# Patient Record
Sex: Male | Born: 1960 | Hispanic: No | Marital: Married | Smoking: Never smoker
Health system: Southern US, Community
[De-identification: ages and names within clinical notes are randomized; demographics above are authoritative.]

## PROBLEM LIST (undated history)

## (undated) DIAGNOSIS — T7840XA Allergy, unspecified, initial encounter: Secondary | ICD-10-CM

## (undated) DIAGNOSIS — E785 Hyperlipidemia, unspecified: Secondary | ICD-10-CM

## (undated) DIAGNOSIS — C4492 Squamous cell carcinoma of skin, unspecified: Secondary | ICD-10-CM

## (undated) DIAGNOSIS — J309 Allergic rhinitis, unspecified: Secondary | ICD-10-CM

## (undated) HISTORY — PX: TOOTH EXTRACTION: SUR596

## (undated) HISTORY — DX: Hyperlipidemia, unspecified: E78.5

## (undated) HISTORY — DX: Allergy, unspecified, initial encounter: T78.40XA

## (undated) HISTORY — PX: MOHS SURGERY: SUR867

## (undated) HISTORY — DX: Squamous cell carcinoma of skin, unspecified: C44.92

## (undated) HISTORY — DX: Allergic rhinitis, unspecified: J30.9

---

## 2013-07-04 ENCOUNTER — Encounter: Payer: Self-pay | Admitting: Family Medicine

## 2013-07-04 ENCOUNTER — Ambulatory Visit (INDEPENDENT_AMBULATORY_CARE_PROVIDER_SITE_OTHER): Payer: 59 | Admitting: Family Medicine

## 2013-07-04 VITALS — BP 128/82 | HR 78 | Temp 98.5°F | Ht 70.75 in | Wt 200.0 lb

## 2013-07-04 DIAGNOSIS — Z7189 Other specified counseling: Secondary | ICD-10-CM

## 2013-07-04 DIAGNOSIS — Z1211 Encounter for screening for malignant neoplasm of colon: Secondary | ICD-10-CM

## 2013-07-04 DIAGNOSIS — Z7689 Persons encountering health services in other specified circumstances: Secondary | ICD-10-CM

## 2013-07-04 DIAGNOSIS — Z23 Encounter for immunization: Secondary | ICD-10-CM

## 2013-07-04 DIAGNOSIS — J309 Allergic rhinitis, unspecified: Secondary | ICD-10-CM

## 2013-07-04 DIAGNOSIS — Z0000 Encounter for general adult medical examination without abnormal findings: Secondary | ICD-10-CM

## 2013-07-04 LAB — CBC WITH DIFFERENTIAL/PLATELET
BASOS ABS: 0 10*3/uL (ref 0.0–0.1)
Basophils Relative: 0.4 % (ref 0.0–3.0)
Eosinophils Absolute: 0.1 10*3/uL (ref 0.0–0.7)
Eosinophils Relative: 2 % (ref 0.0–5.0)
HCT: 44.1 % (ref 39.0–52.0)
Hemoglobin: 15.2 g/dL (ref 13.0–17.0)
LYMPHS PCT: 40.4 % (ref 12.0–46.0)
Lymphs Abs: 1.8 10*3/uL (ref 0.7–4.0)
MCHC: 34.4 g/dL (ref 30.0–36.0)
MCV: 91.7 fl (ref 78.0–100.0)
MONOS PCT: 9.7 % (ref 3.0–12.0)
Monocytes Absolute: 0.4 10*3/uL (ref 0.1–1.0)
Neutro Abs: 2.2 10*3/uL (ref 1.4–7.7)
Neutrophils Relative %: 47.5 % (ref 43.0–77.0)
Platelets: 217 10*3/uL (ref 150.0–400.0)
RBC: 4.81 Mil/uL (ref 4.22–5.81)
RDW: 12.7 % (ref 11.5–15.5)
WBC: 4.6 10*3/uL (ref 4.0–10.5)

## 2013-07-04 LAB — LIPID PANEL
CHOL/HDL RATIO: 4
Cholesterol: 210 mg/dL — ABNORMAL HIGH (ref 0–200)
HDL: 52.1 mg/dL (ref >39.00)
LDL Cholesterol: 144 mg/dL — ABNORMAL HIGH (ref 0–99)
Triglycerides: 72 mg/dL (ref 0.0–149.0)
VLDL: 14.4 mg/dL (ref 0.0–40.0)

## 2013-07-04 LAB — HEMOGLOBIN A1C: HEMOGLOBIN A1C: 5.1 % (ref 4.6–6.5)

## 2013-07-04 MED ORDER — TETANUS-DIPHTH-ACELL PERTUSSIS 5-2.5-18.5 LF-MCG/0.5 IM SUSP: 0.5000 mL | Freq: Once | INTRAMUSCULAR | Status: DC

## 2013-07-04 MED ORDER — FLUTICASONE PROPIONATE 50 MCG/ACT NA SUSP: 2.0000 | Freq: Every day | NASAL | Status: DC

## 2013-07-04 NOTE — Addendum Note (Signed)
Addended by: Agnes Lawrence on: 07/04/2013 09:43 AM   Modules accepted: Orders

## 2013-07-04 NOTE — Progress Notes (Signed)
No chief complaint on file.   HPI:  Glenn Bowman is here to establish care. Has not seen a doctor in about 10 years or more. Last PCP and physical: had labs at work about 2 years ago.  Has the following chronic problems and concerns today:  There are no active problems to display for this patient.  R cervical LAD: -for a few weeks -allergies - nasal congestion, cough, PND -denies: pain, fever, sore throat -always has allergy issues this time of the year  Health Maintenance: -wants tdap today   ROS: See pertinent positives and negatives per HPI.  Past Medical History  Diagnosis Date  . Cancer     basal cell, sees Dr. Delman Cheadle in dermatology    Family History  Problem Relation Age of Onset  . Heart disease Mother 39  . Hypertension Mother   . Cancer Father     multiple myeloma  . Heart disease Father 25  . Hypertension Father   . Hypertension Brother     History   Social History  . Marital Status: Married    Spouse Name: N/A    Number of Children: N/A  . Years of Education: N/A   Social History Main Topics  . Smoking status: Never Smoker   . Smokeless tobacco: None  . Alcohol Use: Yes     Comment: 2 beers 1-2 times per week  . Drug Use: None  . Sexual Activity: None   Other Topics Concern  . 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  GENERAL: vitals reviewed and listed above, alert, oriented, appears well hydrated and in no acute distress  HEENT: atraumatic, conjunttiva  clear, no obvious abnormalities on inspection of external nose and ears, normal appearance of ear canals and TMs, clear nasal congestion, mild post oropharyngeal erythema with PND, no tonsillar edema or exudate, no sinus TTP  NECK: no obvious masses on inspection, few small ant cervical nodes bilat, mobile  LUNGS: clear to auscultation bilaterally, no wheezes, rales or rhonchi, good air movement  CV: HRRR, no peripheral edema  MS: moves all extremities without noticeable abnormality  PSYCH: pleasant and cooperative, no obvious depression or anxiety  ASSESSMENT AND PLAN:  Discussed the following assessment and plan:  Allergic rhinitis - Plan: fluticasone (FLONASE) 50 MCG/ACT nasal spray, CBC with Differential  Encounter to establish care  Visit for preventive health examination - Plan: Lipid Panel, Hemoglobin A1c  Colon cancer screening - Plan: Ambulatory referral to Gastroenterology   -We reviewed the PMH, PSH, FH, SH, Meds and Allergies. -We provided refills for any medications we will prescribe as needed. -We addressed current concerns per orders and patient instructions. -We have asked for records for pertinent exams, studies, vaccines and notes from previous providers. -We have advised patient to follow up per instructions below. -NON-FASTING labs -referred for colon cancer screening after discussion -tdap -  for allergies, LAD, eustachian tube dysfunction antihistamine and INS - follow up in 3 weeks   -Patient advised to return or notify a doctor immediately if symptoms worsen or persist or new concerns arise.  Patient Instructions  -We have ordered labs or studies at this visit. It can take up to 1-2 weeks for results and processing. We will contact you with instructions IF your results are abnormal. Normal results will be released to your [facility]. If you have not heard from [place] or can not find your results in [facility] in 2 weeks please contact our office.  -We placed a  referral for you as discussed for your colonoscopy. It usually takes about 1-2 weeks to process and schedule this referral. If you have not heard from [place] regarding this appointment in 2 weeks please contact our office.   -PLEASE SIGN UP FOR MYCHART TODAY   We recommend the following healthy lifestyle measures: - eat a healthy diet consisting of lots of vegetables, fruits, beans, nuts, seeds, healthy meats such as white chicken and fish and whole grains.  - avoid fried foods, fast food, processed foods, sodas, red meet and other fattening foods.  - get a least 150 minutes of aerobic exercise per week.   Follow up in: 2-3 weeks for swollen glands and sinus issues      Lucretia Kern

## 2013-07-04 NOTE — Progress Notes (Signed)
Pre visit review using our clinic review tool, if applicable. No additional management support is needed unless otherwise documented below in the visit note. 

## 2013-07-04 NOTE — Patient Instructions (Addendum)
-  We have ordered labs or studies at this visit. It can take up to 1-2 weeks for results and processing. We will contact you with instructions IF your results are abnormal. Normal results will be released to your [facility]. If you have not heard from [place] or can not find your results in [facility] in 2 weeks please contact our office.  -We placed a referral for you as discussed for your colonoscopy. It usually takes about 1-2 weeks to process and schedule this referral. If you have not heard from [place] regarding this appointment in 2 weeks please contact our office.   -PLEASE SIGN UP FOR MYCHART TODAY   We recommend the following healthy lifestyle measures: - eat a healthy diet consisting of lots of vegetables, fruits, beans, nuts, seeds, healthy meats such as white chicken and fish and whole grains.  - avoid fried foods, fast food, processed foods, sodas, red meet and other fattening foods.  - get a least 150 minutes of aerobic exercise per week.   Start flonase 2 sprays each nostril daily and take your allergy medication daily  Follow up in: 2-3 weeks for swollen glands and sinus issues

## 2013-07-19 NOTE — Addendum Note (Signed)
Addended by: Agnes Lawrence on: 07/19/2013 12:54 PM   Modules accepted: Orders

## 2013-08-04 ENCOUNTER — Ambulatory Visit (INDEPENDENT_AMBULATORY_CARE_PROVIDER_SITE_OTHER): Payer: 59 | Admitting: Family Medicine

## 2013-08-04 ENCOUNTER — Encounter: Payer: Self-pay | Admitting: Family Medicine

## 2013-08-04 VITALS — BP 118/82 | HR 69 | Temp 98.6°F | Ht 70.75 in | Wt 196.0 lb

## 2013-08-04 DIAGNOSIS — J329 Chronic sinusitis, unspecified: Secondary | ICD-10-CM

## 2013-08-04 DIAGNOSIS — R590 Localized enlarged lymph nodes: Secondary | ICD-10-CM

## 2013-08-04 DIAGNOSIS — H6980 Other specified disorders of Eustachian tube, unspecified ear: Secondary | ICD-10-CM

## 2013-08-04 DIAGNOSIS — R599 Enlarged lymph nodes, unspecified: Secondary | ICD-10-CM

## 2013-08-04 MED ORDER — AMOXICILLIN-POT CLAVULANATE 875-125 MG PO TABS: 1.0000 | ORAL_TABLET | Freq: Two times a day (BID) | ORAL | Status: DC

## 2013-08-04 NOTE — Progress Notes (Signed)
Pre visit review using our clinic review tool, if applicable. No additional management support is needed unless otherwise documented below in the visit note. 

## 2013-08-04 NOTE — Progress Notes (Signed)
No chief complaint on file.   HPI:  Chronic Rhinosinusitis/Cervical LAD/ Eustachian: -treated with antihistamine and INS 1 month ago -reports: not much better - still the same -LAD R cervical, ear fullness, nasal congestion, PND, recent conjuntivitis -denies: fevers, weight loss, sore throat  ROS: See pertinent positives and negatives per HPI.  Past Medical History  Diagnosis Date  . Cancer     basal cell, sees Dr. Delman Cheadle in dermatology    Past Surgical History  Procedure Laterality Date  . Skin surgery      Family History  Problem Relation Age of Onset  . Heart disease Mother 39  . Hypertension Mother   . Cancer Father     multiple myeloma  . Heart disease Father 67  . Hypertension Father   . Hypertension Brother     History   Social History  . Marital Status: Married    Spouse Name: N/A    Number of Children: N/A  . Years of Education: N/A   Social History Main Topics  . Smoking status: Never Smoker   . Smokeless tobacco: None  . Alcohol Use: Yes     Comment: 2 beers 1-2 times per week  . Drug Use: None  . Sexual Activity: None   Other Topics Concern  . 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  GENERAL: vitals reviewed and listed above, alert, oriented, appears well hydrated and in no acute distress  HEENT: atraumatic, conjunttiva clear, no  obvious abnormalities on inspection of external nose and ears, normal appearance of ear canals and TMs except clear effusion R, nasal congestion, mild post oropharyngeal erythema with PND, no tonsillar edema or exudate, no sinus TTP  NECK: no obvious masses on inspection  LUNGS: clear to auscultation bilaterally, no wheezes, rales or rhonchi, good air movement  CV: HRRR, no peripheral edema  MS: moves all extremities without noticeable abnormality  PSYCH: pleasant and cooperative, no obvious depression or anxiety  ASSESSMENT AND PLAN:  Discussed the following assessment and plan:  LAD (lymphadenopathy), cervical - Plan: amoxicillin-clavulanate (AUGMENTIN) 875-125 MG per tablet  Sinusitis - Plan: amoxicillin-clavulanate (AUGMENTIN) 875-125 MG per tablet  -we discussed possible serious and likely etiologies, workup and treatment, treatment risks and return precautions -after this discussion, Dajon opted for course of augmentin then he will call and see ENT if not resolved -follow up advised after finishing abx -of course, we advised [place]  to return or notify a doctor immediately if symptoms worsen or persist or new concerns arise.  -Patient advised to return or notify a doctor immediately if symptoms worsen or persist or new concerns arise.  There are no Patient Instructions on file for this visit.   Colin Benton R.

## 2013-08-07 ENCOUNTER — Encounter: Payer: Self-pay | Admitting: Family Medicine

## 2013-08-21 ENCOUNTER — Encounter: Payer: Self-pay | Admitting: Family Medicine

## 2013-08-21 ENCOUNTER — Ambulatory Visit (INDEPENDENT_AMBULATORY_CARE_PROVIDER_SITE_OTHER): Payer: 59 | Admitting: Family Medicine

## 2013-08-21 VITALS — BP 122/82 | HR 76 | Temp 98.1°F | Ht 70.75 in | Wt 202.0 lb

## 2013-08-21 DIAGNOSIS — R590 Localized enlarged lymph nodes: Secondary | ICD-10-CM

## 2013-08-21 DIAGNOSIS — R599 Enlarged lymph nodes, unspecified: Secondary | ICD-10-CM

## 2013-08-21 NOTE — Progress Notes (Signed)
Pre visit review using our clinic review tool, if applicable. No additional management support is needed unless otherwise documented below in the visit note. 

## 2013-08-21 NOTE — Progress Notes (Signed)
No chief complaint on file.   HPI:  PND/cervical LAD: -reports no change -denies: fevers, malaise, sore throat, congestion, sore throat ROS: See pertinent positives and negatives per HPI.  Past Medical History  Diagnosis Date  . Cancer     basal cell, sees Dr. Delman Cheadle in dermatology    Past Surgical History  Procedure Laterality Date  . Skin surgery      Family History  Problem Relation Age of Onset  . Heart disease Mother 15  . Hypertension Mother   . Cancer Father     multiple myeloma  . Heart disease Father 17  . Hypertension Father   . Hypertension Brother     History   Social History  . Marital Status: Married    Spouse Name: N/A    Number of Children: N/A  . Years of Education: N/A   Social History Main Topics  . Smoking status: Never Smoker   . Smokeless tobacco: None  . Alcohol Use: Yes     Comment: 2 beers 1-2 times per week  . Drug Use: None  . Sexual Activity: None   Other Topics Concern  . 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  EXAM:  Filed Vitals:   08/21/13 0852  BP: 122/82  Pulse: 76  Temp: 98.1 F (36.7 C)    Body mass index is 28.37 kg/(m^2).  GENERAL: vitals reviewed and listed above, alert, oriented, appears well hydrated and in no acute distress  HEENT: atraumatic, conjunttiva clear, no obvious abnormalities on inspection of external nose and ears, small R superior ant cervical lymph nod <1 cm in diameter, non tender  NECK: no obvious masses on inspection  LUNGS: clear to auscultation bilaterally, no wheezes, rales or rhonchi, good air movement  CV: HRRR, no peripheral edema  MS: moves all extremities without noticeable abnormality  PSYCH: pleasant and  cooperative, no obvious depression or anxiety  ASSESSMENT AND PLAN:  Discussed the following assessment and plan:  Cervical lymphadenopathy  -small, nontender R ant cervical LAD, cbc with diff normal, s/p INS and augmentin, he wishes to call ENT on his own to schedule evaluation, brochure provided -we discussed possible serious and likely etiologies, workup and treatment, treatment risks and return precautions -of course, we advised [place]  to return or notify a doctor immediately if symptoms worsen or persist or new concerns arise.  Patient Instructions  -please call the ear, nose and throat doctor for evaluation     Lucretia Kern.

## 2013-08-21 NOTE — Patient Instructions (Signed)
-  please call the ear, nose and throat doctor for evaluation

## 2013-10-24 ENCOUNTER — Encounter: Payer: Self-pay | Admitting: Family Medicine

## 2013-12-08 ENCOUNTER — Other Ambulatory Visit: Payer: Self-pay

## 2016-01-06 ENCOUNTER — Telehealth: Payer: Self-pay | Admitting: Family Medicine

## 2016-01-06 NOTE — Telephone Encounter (Signed)
Pt called in and wanted to know if he could transfer from [facility] to [facility] due to location ?  Is this ok with both of you

## 2016-01-06 NOTE — Telephone Encounter (Signed)
Patient sent a new patient request over for Dr. Sharlet Salina. But it looks like he is a patient of Dr. Maudie Mercury. I called to follow up with him and get some answers. Waiting for patient to call back.

## 2016-01-06 NOTE — Telephone Encounter (Signed)
I am not taking new patients now but other providers in this office are if he wants to make that change.

## 2016-01-28 NOTE — Telephone Encounter (Signed)
Dr. Jenny Reichmann would you be willing to take this patient ok? Please advise. Thank you.

## 2016-01-28 NOTE — Telephone Encounter (Signed)
Ok with me 

## 2016-01-29 NOTE — Telephone Encounter (Signed)
Call patient and LVM to let him know he could make an appointment. &To call back.

## 2016-03-20 ENCOUNTER — Other Ambulatory Visit (INDEPENDENT_AMBULATORY_CARE_PROVIDER_SITE_OTHER): Payer: Managed Care, Other (non HMO)

## 2016-03-20 ENCOUNTER — Other Ambulatory Visit: Payer: Self-pay | Admitting: Internal Medicine

## 2016-03-20 ENCOUNTER — Encounter: Payer: Self-pay | Admitting: Internal Medicine

## 2016-03-20 ENCOUNTER — Ambulatory Visit (INDEPENDENT_AMBULATORY_CARE_PROVIDER_SITE_OTHER): Payer: Managed Care, Other (non HMO) | Admitting: Internal Medicine

## 2016-03-20 VITALS — BP 130/72 | HR 84 | Temp 98.4°F | Resp 20 | Wt 207.0 lb

## 2016-03-20 DIAGNOSIS — Z0001 Encounter for general adult medical examination with abnormal findings: Secondary | ICD-10-CM | POA: Insufficient documentation

## 2016-03-20 DIAGNOSIS — E785 Hyperlipidemia, unspecified: Secondary | ICD-10-CM

## 2016-03-20 DIAGNOSIS — R739 Hyperglycemia, unspecified: Secondary | ICD-10-CM

## 2016-03-20 DIAGNOSIS — H5789 Other specified disorders of eye and adnexa: Secondary | ICD-10-CM | POA: Insufficient documentation

## 2016-03-20 DIAGNOSIS — Z1159 Encounter for screening for other viral diseases: Secondary | ICD-10-CM

## 2016-03-20 DIAGNOSIS — Z0000 Encounter for general adult medical examination without abnormal findings: Secondary | ICD-10-CM | POA: Insufficient documentation

## 2016-03-20 DIAGNOSIS — C801 Malignant (primary) neoplasm, unspecified: Secondary | ICD-10-CM | POA: Insufficient documentation

## 2016-03-20 DIAGNOSIS — C4492 Squamous cell carcinoma of skin, unspecified: Secondary | ICD-10-CM | POA: Insufficient documentation

## 2016-03-20 DIAGNOSIS — J309 Allergic rhinitis, unspecified: Secondary | ICD-10-CM | POA: Insufficient documentation

## 2016-03-20 LAB — URINALYSIS, ROUTINE W REFLEX MICROSCOPIC
BILIRUBIN URINE: NEGATIVE
Hgb urine dipstick: NEGATIVE
Ketones, ur: NEGATIVE
LEUKOCYTES UA: NEGATIVE
Nitrite: NEGATIVE
PH: 6 (ref 5.0–8.0)
Specific Gravity, Urine: <=1.005 — AB (ref 1.000–1.030)
TOTAL PROTEIN, URINE-UPE24: NEGATIVE
UROBILINOGEN UA: 0.2 (ref 0.0–1.0)
Urine Glucose: NEGATIVE

## 2016-03-20 LAB — BASIC METABOLIC PANEL
BUN: 14 mg/dL (ref 6–23)
CALCIUM: 9.6 mg/dL (ref 8.4–10.5)
CHLORIDE: 103 meq/L (ref 96–112)
CO2: 29 meq/L (ref 19–32)
Creatinine, Ser: 1.04 mg/dL (ref 0.40–1.50)
GFR: 79.29 mL/min (ref >60.00)
GLUCOSE: 100 mg/dL — AB (ref 70–99)
POTASSIUM: 4.4 meq/L (ref 3.5–5.1)
SODIUM: 140 meq/L (ref 135–145)

## 2016-03-20 LAB — HEPATIC FUNCTION PANEL
ALBUMIN: 4.7 g/dL (ref 3.5–5.2)
ALK PHOS: 70 U/L (ref 39–117)
ALT: 26 U/L (ref 0–53)
AST: 20 U/L (ref 0–37)
BILIRUBIN DIRECT: 0.1 mg/dL (ref 0.0–0.3)
TOTAL PROTEIN: 7.3 g/dL (ref 6.0–8.3)
Total Bilirubin: 0.7 mg/dL (ref 0.2–1.2)

## 2016-03-20 LAB — LIPID PANEL
CHOLESTEROL: 205 mg/dL — AB (ref 0–200)
HDL: 55.5 mg/dL (ref >39.00)
LDL Cholesterol: 138 mg/dL — ABNORMAL HIGH (ref 0–99)
NONHDL: 149.14
Total CHOL/HDL Ratio: 4
Triglycerides: 54 mg/dL (ref 0.0–149.0)
VLDL: 10.8 mg/dL (ref 0.0–40.0)

## 2016-03-20 LAB — CBC WITH DIFFERENTIAL/PLATELET
BASOS ABS: 0 10*3/uL (ref 0.0–0.1)
Basophils Relative: 0.3 % (ref 0.0–3.0)
EOS ABS: 0.1 10*3/uL (ref 0.0–0.7)
Eosinophils Relative: 1.6 % (ref 0.0–5.0)
HCT: 46.9 % (ref 39.0–52.0)
Hemoglobin: 16.3 g/dL (ref 13.0–17.0)
LYMPHS ABS: 1.5 10*3/uL (ref 0.7–4.0)
Lymphocytes Relative: 30.6 % (ref 12.0–46.0)
MCHC: 34.8 g/dL (ref 30.0–36.0)
MCV: 93.7 fl (ref 78.0–100.0)
MONO ABS: 0.4 10*3/uL (ref 0.1–1.0)
MONOS PCT: 7.8 % (ref 3.0–12.0)
NEUTROS ABS: 3 10*3/uL (ref 1.4–7.7)
NEUTROS PCT: 59.7 % (ref 43.0–77.0)
Platelets: 218 10*3/uL (ref 150.0–400.0)
RBC: 5 Mil/uL (ref 4.22–5.81)
RDW: 12.7 % (ref 11.5–15.5)
WBC: 5 10*3/uL (ref 4.0–10.5)

## 2016-03-20 LAB — HEPATITIS C ANTIBODY: HCV AB: NEGATIVE

## 2016-03-20 LAB — TSH: TSH: 2.21 u[IU]/mL (ref 0.35–4.50)

## 2016-03-20 LAB — PSA: PSA: 0.96 ng/mL (ref 0.10–4.00)

## 2016-03-20 MED ORDER — ROSUVASTATIN CALCIUM 10 MG PO TABS: 10.0000 mg | ORAL_TABLET | Freq: Every day | ORAL | 3 refills | Status: DC

## 2016-03-20 MED ORDER — ASPIRIN EC 81 MG PO TBEC: 81.0000 mg | DELAYED_RELEASE_TABLET | Freq: Every day | ORAL | 11 refills | Status: AC

## 2016-03-20 NOTE — Patient Instructions (Addendum)
Please start Aspirin 81 mg - 1 per day  Please continue all other medications as before  Please have the pharmacy call with any other refills you may need.  Please continue your efforts at being more active, low cholesterol diet, and weight control.  You are otherwise up to date with prevention measures today.  Please keep your appointments with your specialists as you may have planned  You will be contacted regarding the referral for: colonoscopy  Please go to the LAB in the Basement (turn left off the elevator) for the tests to be done today  You will be contacted by phone if any changes need to be made immediately.  Otherwise, you will receive a letter about your results with an explanation, but please check with MyChart first.  Please remember to sign up for MyChart if you have not done so, as this will be important to you in the future with finding out test results, communicating by private email, and scheduling acute appointments online when needed.  Please return in 1 year for your yearly visit, or sooner if needed, with Lab testing done 3-5 days before

## 2016-03-20 NOTE — Progress Notes (Signed)
Subjective:    Patient ID: Glenn Bowman, male    DOB: Jul 05, 1960, 56 y.o.   MRN: [id number]  HPI Here for wellness and f/u;  Overall doing ok;  Pt denies Chest pain, worsening SOB, DOE, wheezing, orthopnea, PND, worsening LE edema, palpitations, dizziness or syncope.  Pt denies neurological change such as new headache, facial or extremity weakness.  Pt denies polydipsia, polyuria, or low sugar symptoms. Pt states overall good compliance with treatment and medications, good tolerability, and has been trying to follow appropriate diet.  Pt denies worsening depressive symptoms, suicidal ideation or panic. No fever, night sweats, wt loss, loss of appetite, or other constitutional symptoms.  Pt states good ability with ADL's, has low fall risk, home safety reviewed and adequate, no other significant changes in hearing or vision, and only occasionally active with exercise.  Due for Hep c, has not seen an MD in over 2 yrs. No new complaints  No other new history Past Medical History:  Diagnosis Date  . Allergic rhinitis   . Hyperlipidemia   . Squamous cell skin cancer    Past Surgical History:  Procedure Laterality Date  . SKIN SURGERY      reports that he has never smoked. He does not have any smokeless tobacco history on file. He reports that he drinks alcohol. His drug history is not on file. family history includes Cancer in his father; Heart disease (age of onset: 2) in his father; Heart disease (age of onset: 89) in his mother; Hypertension in his brother, father, and mother. No Known Allergies No current outpatient prescriptions on file prior to visit.   No current facility-administered medications on file prior to visit.    Review of Systems Constitutional: Negative for increased diaphoresis, or other activity, appetite or siginficant weight change other than noted HENT: Negative for worsening hearing loss, ear pain, facial swelling, mouth sores and neck stiffness.   Eyes: Negative for  other worsening pain, redness or visual disturbance.  Respiratory: Negative for choking or stridor Cardiovascular: Negative for other chest pain and palpitations.  Gastrointestinal: Negative for worsening diarrhea, blood in stool, or abdominal distention Genitourinary: Negative for hematuria, flank pain or change in urine volume.  Musculoskeletal: Negative for myalgias or other joint complaints.  Skin: Negative for other color change and wound or drainage.  Neurological: Negative for syncope and numbness. other than noted Hematological: Negative for adenopathy. or other swelling Psychiatric/Behavioral: Negative for hallucinations, SI, self-injury, decreased concentration or other worsening agitation.  All other system neg per pt    Objective:   Physical Exam BP 130/72   Pulse 84   Temp 98.4 F (36.9 C) (Oral)   Resp 20   Wt 207 lb (93.9 kg)   SpO2 97%   BMI 29.08 kg/m  VS noted,  Constitutional: Pt is oriented to person, place, and time. Appears well-developed and well-nourished, in no significant distress Head: Normocephalic and atraumatic  Eyes: right Conjunctivae clear but has upper and lower eyelid chronic erythema, slight swelling, and EOM are normal. Pupils are equal, round, and reactive to light Right Ear: External ear normal.  Left Ear: External ear normal Nose: Nose normal.  Mouth/Throat: Oropharynx is clear and moist  Neck: Normal range of motion. Neck supple. No JVD present. No tracheal deviation present or significant neck LA or mass Cardiovascular: Normal rate, regular rhythm, normal heart sounds and intact distal pulses.   Pulmonary/Chest: Effort normal and breath sounds without rales or wheezing  Abdominal: Soft. Bowel sounds are  normal. NT. No HSM  Musculoskeletal: Normal range of motion. Exhibits no edema Lymphadenopathy: Has no cervical adenopathy.  Neurological: Pt is alert and oriented to person, place, and time. Pt has normal reflexes. No cranial nerve  deficit. Motor grossly intact Skin: Skin is warm and dry. No rash noted or new ulcers Psychiatric:  Has normal mood and affect. Behavior is normal. \ No other new exam findings    Assessment & Plan:

## 2016-03-20 NOTE — Progress Notes (Signed)
Pre visit review using our clinic review tool, if applicable. No additional management support is needed unless otherwise documented below in the visit note. 

## 2016-03-21 DIAGNOSIS — R739 Hyperglycemia, unspecified: Secondary | ICD-10-CM | POA: Insufficient documentation

## 2016-03-21 NOTE — Assessment & Plan Note (Signed)
For lower chol diet, consider statin for ldl > 100

## 2016-03-21 NOTE — Assessment & Plan Note (Addendum)

## 2016-03-23 ENCOUNTER — Telehealth: Payer: Self-pay | Admitting: Internal Medicine

## 2016-03-23 NOTE — Telephone Encounter (Signed)
Patient called back.  Gave MD response on lab results.

## 2016-06-01 ENCOUNTER — Encounter: Payer: Self-pay | Admitting: Internal Medicine

## 2016-10-19 ENCOUNTER — Encounter: Payer: Self-pay | Admitting: Gastroenterology

## 2016-11-16 ENCOUNTER — Ambulatory Visit (INDEPENDENT_AMBULATORY_CARE_PROVIDER_SITE_OTHER): Payer: 59

## 2016-11-16 ENCOUNTER — Encounter: Payer: Self-pay | Admitting: Podiatry

## 2016-11-16 ENCOUNTER — Ambulatory Visit (INDEPENDENT_AMBULATORY_CARE_PROVIDER_SITE_OTHER): Payer: 59 | Admitting: Podiatry

## 2016-11-16 ENCOUNTER — Other Ambulatory Visit: Payer: Self-pay | Admitting: Podiatry

## 2016-11-16 VITALS — BP 137/81 | HR 68

## 2016-11-16 DIAGNOSIS — M779 Enthesopathy, unspecified: Secondary | ICD-10-CM | POA: Diagnosis not present

## 2016-11-16 DIAGNOSIS — M79671 Pain in right foot: Secondary | ICD-10-CM | POA: Diagnosis not present

## 2016-11-16 MED ORDER — TRIAMCINOLONE ACETONIDE 10 MG/ML IJ SUSP
10.0000 mg | Freq: Once | INTRAMUSCULAR | Status: AC
  Administered 2016-11-16: 10 mg

## 2016-11-16 NOTE — Progress Notes (Signed)
   Subjective:    Patient ID: Glenn Bowman, male    DOB: 1960/09/19, 56 y.o.   MRN: [id number]  HPI    Review of Systems  Eyes: Positive for redness.  All other systems reviewed and are negative.      Objective:   Physical Exam        Assessment & Plan:

## 2016-11-17 NOTE — Progress Notes (Signed)
Subjective:    Patient ID: Glenn Bowman, male   DOB: 56 y.o.   MRN: [id number]   HPI patient presents over the last few months he's develop discomfort in the bottom of his right foot and does not remember injury. States it feels swollen and makes it hard to walk on the side of his foot and patient gives a history of no smoking    Review of Systems  All other systems reviewed and are negative.       Objective:  Physical Exam  Constitutional: He appears well-developed and well-nourished.  Cardiovascular: Intact distal pulses.   Pulmonary/Chest: Effort normal.  Musculoskeletal: Normal range of motion.  Neurological: He is alert.  Skin: Skin is warm.  Nursing note and vitals reviewed.  neurovascular status intact muscle strength adequate range of motion within normal limits with patient found to have inflammation and fluid around the fifth MPJ right that's moderately tender when pressed and making walking and shoe gear difficult. No current keratotic lesion formation and patient's found have good digital perfusion     Assessment:  Inflammatory changes of the fifth MPJ right with fluid buildup around the joint surface       Plan:    H&P condition reviewed and at this point I did a capsular plantar injection right 3 mg Kenalog 5 mg Xylocaine and advised on rigid bottom shoes and if symptoms persist may need to consider a osteotomy of the metatarsal bone  X-rays indicate that there is some swelling around the fifth MPJ right but no indications of other pathology

## 2016-12-02 ENCOUNTER — Ambulatory Visit (AMBULATORY_SURGERY_CENTER): Payer: Self-pay | Admitting: *Deleted

## 2016-12-02 ENCOUNTER — Encounter: Payer: Self-pay | Admitting: Gastroenterology

## 2016-12-02 VITALS — Ht 70.5 in | Wt 204.0 lb

## 2016-12-02 DIAGNOSIS — Z1211 Encounter for screening for malignant neoplasm of colon: Secondary | ICD-10-CM

## 2016-12-02 MED ORDER — NA SULFATE-K SULFATE-MG SULF 17.5-3.13-1.6 GM/177ML PO SOLN: 1.0000 | Freq: Once | ORAL | 0 refills | Status: AC

## 2016-12-02 NOTE — Progress Notes (Signed)
No egg or soy allergy known to patient  No issues with past sedation with any surgeries  or procedures, no intubation problems  No diet pills per patient No home 02 use per patient  No blood thinners per patient  Pt denies issues with constipation  No A fib or A flutter  EMMI video sent to pt's e mail - declined states brother is  A GI dr in [place]  $15 coupon to pt in [place] today

## 2016-12-16 ENCOUNTER — Encounter: Payer: Self-pay | Admitting: Gastroenterology

## 2016-12-16 ENCOUNTER — Ambulatory Visit (AMBULATORY_SURGERY_CENTER): Payer: 59 | Admitting: Gastroenterology

## 2016-12-16 VITALS — BP 108/61 | HR 83 | Temp 97.5°F | Resp 30 | Ht 70.0 in | Wt 204.0 lb

## 2016-12-16 DIAGNOSIS — Z1212 Encounter for screening for malignant neoplasm of rectum: Secondary | ICD-10-CM

## 2016-12-16 DIAGNOSIS — Z1211 Encounter for screening for malignant neoplasm of colon: Secondary | ICD-10-CM | POA: Diagnosis present

## 2016-12-16 MED ORDER — SODIUM CHLORIDE 0.9 % IV SOLN: 500.0000 mL | INTRAVENOUS | Status: DC

## 2016-12-16 NOTE — Op Note (Signed)
[facility] Patient Name: Glenn Bowman Procedure Date: 12/16/2016 10:50 AM MRN: [id number] Endoscopist: Milus Banister , MD Age: 56 Referring MD:  Date of Birth: 1960-11-08 Gender: Male Account #: [id number] Procedure:                Colonoscopy Indications:              Screening for colorectal malignant neoplasm Medicines:                Monitored Anesthesia Care Procedure:                Pre-Anesthesia Assessment:                           - Prior to the procedure, a History and Physical                            was performed, and patient medications and                            allergies were reviewed. The patient's tolerance of                            previous anesthesia was also reviewed. The risks                            and benefits of the procedure and the sedation                            options and risks were discussed with the patient.                            All questions were answered, and informed consent                            was obtained. Prior Anticoagulants: The patient has                            taken no previous anticoagulant or antiplatelet                            agents. ASA Grade Assessment: II - A patient with                            mild systemic disease. After reviewing the risks                            and benefits, the patient was deemed in                            satisfactory condition to undergo the procedure.                           After obtaining informed consent, the colonoscope  was passed under direct vision. Throughout the                            procedure, the patient's blood pressure, pulse, and                            oxygen saturations were monitored continuously. The                            Colonoscope was introduced through the anus and                            advanced to the the cecum, identified by                            appendiceal orifice and  ileocecal valve. The                            colonoscopy was performed without difficulty. The                            patient tolerated the procedure well. The quality                            of the bowel preparation was good. The ileocecal                            valve, appendiceal orifice, and rectum were                            photographed. Scope In: 10:55:50 AM Scope Out: 81:01:75 AM Scope Withdrawal Time: 0 hours 7 minutes 3 seconds  Total Procedure Duration: 0 hours 10 minutes 48 seconds  Findings:                 The entire examined colon appeared normal on direct                            and retroflexion views. Complications:            No immediate complications. Estimated blood loss:                            None. Estimated Blood Loss:     Estimated blood loss: none. Impression:               - The entire examined colon is normal on direct and                            retroflexion views.                           - No polyps or cancers. Recommendation:           - Patient has a contact number available for  emergencies. The signs and symptoms of potential                            delayed complications were discussed with the                            patient. Return to normal activities tomorrow.                            Written discharge instructions were provided to the                            patient.                           - Resume previous diet.                           - Continue present medications.                           - Repeat colonoscopy in 10 years for screening                            purposes. Milus Banister, MD 12/16/2016 11:10:01 AM This report has been signed electronically.

## 2016-12-16 NOTE — Progress Notes (Signed)
A and O x3. Report to RN. Tolerated MAC anesthesia well.

## 2016-12-16 NOTE — Patient Instructions (Signed)
YOU HAD AN ENDOSCOPIC PROCEDURE TODAY AT THE [facility]:   Refer to the procedure report that was given to you for any specific questions about what was found during the examination.  If the procedure report does not answer your questions, please call your gastroenterologist to clarify.  If you requested that your care partner not be given the details of your procedure findings, then the procedure report has been included in a sealed envelope for you to review at your convenience later.  YOU SHOULD EXPECT: Some feelings of bloating in the abdomen. Passage of more gas than usual.  Walking can help get rid of the air that was put into your GI tract during the procedure and reduce the bloating. If you had a lower endoscopy (such as a colonoscopy or flexible sigmoidoscopy) you may notice spotting of blood in your stool or on the toilet paper. If you underwent a bowel prep for your procedure, you may not have a normal bowel movement for a few days.  Please Note:  You might notice some irritation and congestion in your nose or some drainage.  This is from the oxygen used during your procedure.  There is no need for concern and it should clear up in a day or so.  SYMPTOMS TO REPORT IMMEDIATELY:   Following lower endoscopy (colonoscopy or flexible sigmoidoscopy):  Excessive amounts of blood in the stool  Significant tenderness or worsening of abdominal pains  Swelling of the abdomen that is new, acute  Fever of 100F or higher  For urgent or emergent issues, a gastroenterologist can be reached at any hour by calling [phone].   DIET:  We do recommend a small meal at first, but then you may proceed to your regular diet.  Drink plenty of fluids but you should avoid alcoholic beverages for 24 hours.  ACTIVITY:  You should plan to take it easy for the rest of today and you should NOT DRIVE or use heavy machinery until tomorrow (because of the sedation medicines used during the test).     FOLLOW UP: Our staff will call the number listed on your records the next business day following your procedure to check on you and address any questions or concerns that you may have regarding the information given to you following your procedure. If we do not reach you, we will leave a message.  However, if you are feeling well and you are not experiencing any problems, there is no need to return our call.  We will assume that you have returned to your regular daily activities without incident.  If any biopsies were taken you will be contacted by phone or by letter within the next 1-3 weeks.  Please call us at [phone] if you have not heard about the biopsies in 3 weeks.    SIGNATURES/CONFIDENTIALITY: You and/or your care partner have signed paperwork which will be entered into your electronic medical record.  These signatures attest to the fact that that the information above on your After Visit Summary has been reviewed and is understood.  Full responsibility of the confidentiality of this discharge information lies with you and/or your care-partner. 

## 2016-12-17 ENCOUNTER — Telehealth: Payer: Self-pay | Admitting: *Deleted

## 2016-12-17 ENCOUNTER — Telehealth: Payer: Self-pay

## 2016-12-17 NOTE — Telephone Encounter (Signed)
  Follow up Call-  Call back number 12/16/2016  Post procedure Call Back phone  # [phone]  Permission to leave phone message Yes  Some recent data might be hidden     Patient questions:  Message left to call us if necessary.

## 2016-12-17 NOTE — Telephone Encounter (Signed)
Number identifier. Left a message.

## 2017-01-01 ENCOUNTER — Encounter: Payer: Self-pay | Admitting: Internal Medicine

## 2017-01-01 ENCOUNTER — Ambulatory Visit (INDEPENDENT_AMBULATORY_CARE_PROVIDER_SITE_OTHER): Payer: 59 | Admitting: Internal Medicine

## 2017-01-01 ENCOUNTER — Other Ambulatory Visit (INDEPENDENT_AMBULATORY_CARE_PROVIDER_SITE_OTHER): Payer: 59

## 2017-01-01 VITALS — BP 126/78 | HR 96 | Temp 98.8°F | Ht 70.0 in | Wt 203.0 lb

## 2017-01-01 DIAGNOSIS — R1032 Left lower quadrant pain: Secondary | ICD-10-CM

## 2017-01-01 LAB — BASIC METABOLIC PANEL
BUN: 15 mg/dL (ref 6–23)
CALCIUM: 9.6 mg/dL (ref 8.4–10.5)
CHLORIDE: 103 meq/L (ref 96–112)
CO2: 29 meq/L (ref 19–32)
Creatinine, Ser: 1.04 mg/dL (ref 0.40–1.50)
GFR: 79.05 mL/min (ref >60.00)
Glucose, Bld: 107 mg/dL — ABNORMAL HIGH (ref 70–99)
Potassium: 4.3 mEq/L (ref 3.5–5.1)
Sodium: 139 mEq/L (ref 135–145)

## 2017-01-01 LAB — HEPATIC FUNCTION PANEL
ALBUMIN: 4.5 g/dL (ref 3.5–5.2)
ALK PHOS: 65 U/L (ref 39–117)
ALT: 21 U/L (ref 0–53)
AST: 18 U/L (ref 0–37)
BILIRUBIN DIRECT: 0.1 mg/dL (ref 0.0–0.3)
TOTAL PROTEIN: 7.2 g/dL (ref 6.0–8.3)
Total Bilirubin: 0.7 mg/dL (ref 0.2–1.2)

## 2017-01-01 LAB — URINALYSIS, ROUTINE W REFLEX MICROSCOPIC
HGB URINE DIPSTICK: NEGATIVE
LEUKOCYTES UA: NEGATIVE
Nitrite: NEGATIVE
RBC / HPF: NONE SEEN (ref 0–?)
Specific Gravity, Urine: 1.025 (ref 1.000–1.030)
Total Protein, Urine: NEGATIVE
URINE GLUCOSE: NEGATIVE
UROBILINOGEN UA: 1 (ref 0.0–1.0)
pH: 6 (ref 5.0–8.0)

## 2017-01-01 LAB — CBC WITH DIFFERENTIAL/PLATELET
BASOS ABS: 0 10*3/uL (ref 0.0–0.1)
Basophils Relative: 0.8 % (ref 0.0–3.0)
EOS ABS: 0.1 10*3/uL (ref 0.0–0.7)
Eosinophils Relative: 1.6 % (ref 0.0–5.0)
HEMATOCRIT: 46.3 % (ref 39.0–52.0)
Hemoglobin: 15.9 g/dL (ref 13.0–17.0)
Lymphocytes Relative: 23.4 % (ref 12.0–46.0)
Lymphs Abs: 1.5 10*3/uL (ref 0.7–4.0)
MCHC: 34.3 g/dL (ref 30.0–36.0)
MCV: 99.4 fl (ref 78.0–100.0)
MONOS PCT: 9 % (ref 3.0–12.0)
Monocytes Absolute: 0.6 10*3/uL (ref 0.1–1.0)
Neutro Abs: 4.1 10*3/uL (ref 1.4–7.7)
Neutrophils Relative %: 65.2 % (ref 43.0–77.0)
Platelets: 231 10*3/uL (ref 150.0–400.0)
RBC: 4.66 Mil/uL (ref 4.22–5.81)
RDW: 12.2 % (ref 11.5–15.5)
WBC: 6.3 10*3/uL (ref 4.0–10.5)

## 2017-01-01 LAB — LIPASE: Lipase: 27 U/L (ref 11.0–59.0)

## 2017-01-01 LAB — TSH: TSH: 2.13 u[IU]/mL (ref 0.35–4.50)

## 2017-01-01 NOTE — Progress Notes (Signed)
Subjective:    Patient ID: Glenn Bowman, male    DOB: 1960-12-26, 56 y.o.   MRN: [id number]  HPI  Here with c/o 2-3 mo of LLQ pain, mild to mod, intermittent but recurring persistently, no radiation, no n/v, no decreased appetite or wt loss, fever; last colonoscopy just done oct 24 neg for any significant abnormality.  Denies urinary symptoms such as dysuria, frequency, urgency, flank pain, hematuria or n/v, fever, chills. No swelling, lumps or hernias.  No prior hx.  No prior CT scan. No recent or recurring constipation. No hx of renal stones. Does a lot of yard work, can be felt more after tennsis, also sometimes sore to touch.  Pt denies chest pain, increased sob or doe, wheezing, orthopnea, PND, increased LE swelling, palpitations, dizziness or syncope.  Pt denies new neurological symptoms such as new headache, or facial or extremity weakness or numbness Past Medical History:  Diagnosis Date  . Allergic rhinitis   . Allergy   . Hyperlipidemia   . Squamous cell skin cancer    Past Surgical History:  Procedure Laterality Date  . MOHS SURGERY    . TOOTH EXTRACTION      reports that  has never smoked. he has never used smokeless tobacco. He reports that he drinks alcohol. He reports that he does not use drugs. family history includes Cancer in his father; Colon cancer in his maternal grandmother; Colon polyps in his brother; Heart disease (age of onset: 23) in his father; Heart disease (age of onset: 64) in his mother; Hypertension in his brother, father, and mother. Allergies  Allergen Reactions  . Thimerosal Other (See Comments)    Ocular irritation   Current Outpatient Medications on File Prior to Visit  Medication Sig Dispense Refill  . aspirin EC 81 MG tablet Take 1 tablet (81 mg total) by mouth daily. 90 tablet 11  . doxycycline (PERIOSTAT) 20 MG tablet Take 20 mg by mouth 2 (two) times daily.    . fluorometholone (FML) 0.1 % ophthalmic suspension     . rosuvastatin (CRESTOR) 10  MG tablet Take 1 tablet (10 mg total) by mouth daily. 90 tablet 3  . valACYclovir (VALTREX) 1000 MG tablet Take 1,000 mg by mouth 2 (two) times daily.     No current facility-administered medications on file prior to visit.    Review of Systems  Constitutional: Negative for other unusual diaphoresis or sweats HENT: Negative for ear discharge or swelling Eyes: Negative for other worsening visual disturbances Respiratory: Negative for stridor or other swelling  Gastrointestinal: Negative for worsening distension or other blood Genitourinary: Negative for retention or other urinary change Musculoskeletal: Negative for other MSK pain or swelling Skin: Negative for color change or other new lesions Neurological: Negative for worsening tremors and other numbness  Psychiatric/Behavioral: Negative for worsening agitation or other fatigue All other system neg per pt    Objective:   Physical Exam BP 126/78   Pulse 96   Temp 98.8 F (37.1 C) (Oral)   Ht 5\' 10"  (1.778 m)   Wt 203 lb (92.1 kg)   SpO2 98%   BMI 29.13 kg/m  VS noted,  Constitutional: Pt appears in NAD HENT: Head: NCAT.  Right Ear: External ear normal.  Left Ear: External ear normal.  Eyes: . Pupils are equal, round, and reactive to light. Conjunctivae and EOM are normal Nose: without d/c or deformity Neck: Neck supple. Gross normal ROM Cardiovascular: Normal rate and regular rhythm.   Pulmonary/Chest: Effort normal  and breath sounds without rales or wheezing.  Abd:  Soft, ND, + BS, no organomegaly with mild tender to deeper palpation LLQ and iliac crest, no guarding or rebound, no inguinal hernia on standing Neurological: Pt is alert. At baseline orientation, motor grossly intact Skin: Skin is warm. No rashes, other new lesions, no LE edema Psychiatric: Pt behavior is normal without agitation  No other exam findings    Assessment & Plan:

## 2017-01-01 NOTE — Patient Instructions (Signed)
Please continue all other medications as before, and refills have been done if requested.  Please have the pharmacy call with any other refills you may need.  Please keep your appointments with your specialists as you may have planned  You will be contacted regarding the referral for: CT scan  Please go to the LAB in the Basement (turn left off the elevator) for the tests to be done today  You will be contacted by phone if any changes need to be made immediately.  Otherwise, you will receive a letter about your results with an explanation, but please check with MyChart first.  Please remember to sign up for MyChart if you have not done so, as this will be important to you in the future with finding out test results, communicating by private email, and scheduling acute appointments online when needed.

## 2017-01-03 NOTE — Assessment & Plan Note (Addendum)
?   MSk vs other, has been persistent however, pt is afeb and hx and exam o/w benign, will need CT abd/pelvis for definitive management, with tx pending results, also for labs today as ordered

## 2017-01-08 ENCOUNTER — Telehealth: Payer: Self-pay

## 2017-01-08 ENCOUNTER — Ambulatory Visit (INDEPENDENT_AMBULATORY_CARE_PROVIDER_SITE_OTHER)
Admission: RE | Admit: 2017-01-08 | Discharge: 2017-01-08 | Disposition: A | Discharge status: Home / Self Care | Payer: 59 | Source: Ambulatory Visit | Attending: Internal Medicine | Admitting: Internal Medicine

## 2017-01-08 ENCOUNTER — Other Ambulatory Visit: Payer: Self-pay | Admitting: Internal Medicine

## 2017-01-08 ENCOUNTER — Encounter: Payer: Self-pay | Admitting: Internal Medicine

## 2017-01-08 DIAGNOSIS — R1032 Left lower quadrant pain: Secondary | ICD-10-CM

## 2017-01-08 DIAGNOSIS — R935 Abnormal findings on diagnostic imaging of other abdominal regions, including retroperitoneum: Secondary | ICD-10-CM

## 2017-01-08 DIAGNOSIS — R1904 Left lower quadrant abdominal swelling, mass and lump: Secondary | ICD-10-CM

## 2017-01-08 MED ORDER — CIPROFLOXACIN HCL 500 MG PO TABS: 500.0000 mg | ORAL_TABLET | Freq: Two times a day (BID) | ORAL | 0 refills | Status: AC

## 2017-01-08 MED ORDER — METRONIDAZOLE 250 MG PO TABS: 250.0000 mg | ORAL_TABLET | Freq: Three times a day (TID) | ORAL | 0 refills | Status: AC

## 2017-01-08 NOTE — Telephone Encounter (Signed)
Called pt, LVM.   

## 2017-01-08 NOTE — Telephone Encounter (Signed)
-----   Message from Biagio Borg, MD sent at 01/08/2017 12:35 PM EST ----- Left message on MyChart, pt to cont same tx except  The test results show that your current treatment is OK, except there is some evidence for inflammation and possible infection.  We will need to treat with antibiotics, but also we should refer you for follow up about this to Gastroenterology.  You should hear soon.    Rajeev Escue to please inform pt, I will do antibiotics rx (cipro and flagyl), and refer GI

## 2017-01-20 ENCOUNTER — Ambulatory Visit (INDEPENDENT_AMBULATORY_CARE_PROVIDER_SITE_OTHER): Payer: 59 | Admitting: Physician Assistant

## 2017-01-20 ENCOUNTER — Encounter: Payer: Self-pay | Admitting: Physician Assistant

## 2017-01-20 VITALS — BP 110/78 | HR 68 | Ht 70.0 in | Wt 206.4 lb

## 2017-01-20 DIAGNOSIS — R1032 Left lower quadrant pain: Secondary | ICD-10-CM | POA: Diagnosis not present

## 2017-01-20 DIAGNOSIS — R9389 Abnormal findings on diagnostic imaging of other specified body structures: Secondary | ICD-10-CM

## 2017-01-20 NOTE — Progress Notes (Signed)
Chief Complaint: Abnormal CT of the abdomen, left lower quadrant pain  HPI:    [name] is a 56 year old Caucasian male with a past medical history as listed below, who follows with Dr. Ardis Hughs and was referred to me by Biagio Borg, MD for a complaint of abnormal CT of the abdomen.      Patient's most recent colonoscopy was 12/16/16 and was completely normal with no polyps or cancers or diverticula.    Review of referring physicians notes shows patient was seen in office 01/01/17 with a complaint of 2-3 months of left lower quadrant pain which is mild to moderate and intermittent but recurring persistently.  Patient had a CT of the abdomen and pelvis on 01/08/17 which showed mildly prominent left abdominal mesenteric lymph nodes with surrounding mesenteric stranding.  This was thought to reflect mesenteric panniculitis or adenitis.  It was discussed this could also be seen with a lymphoproliferative disorder such as lymphoma and a repeat CT was recommended in 3-6 months.  Patient was started on Ciprofloxacin twice daily times 10 days and Flagyl 3 times daily times 7 days by his PCP.    Today, the patient presents to clinic and tells me that he has had this left lower quadrant pain off and on which is only rated as a 2-3/10 and sometimes increased in intensity over the past 3 months or so.  Patient tells me he thought maybe "I pulled this when working out".  He happened to mention this when seeing his primary care physician and has a CT as above.  Patient tells me he recently finished antibiotics yesterday and has had some improvement in this pain.  Patient denies any change in bowel habits and maintains a regular solid bowel movement today per day.      He denies nausea, vomiting, fever, chills or other GI complaints.  Past Medical History:  Diagnosis Date  . Allergic rhinitis   . Allergy   . Hyperlipidemia   . Squamous cell skin cancer     Past Surgical History:  Procedure Laterality Date  .  MOHS SURGERY    . TOOTH EXTRACTION      Current Outpatient Medications  Medication Sig Dispense Refill  . aspirin EC 81 MG tablet Take 1 tablet (81 mg total) by mouth daily. 90 tablet 11  . doxycycline (PERIOSTAT) 20 MG tablet Take 20 mg by mouth 2 (two) times daily.    . fluorometholone (FML) 0.1 % ophthalmic suspension     . rosuvastatin (CRESTOR) 10 MG tablet Take 1 tablet (10 mg total) by mouth daily. 90 tablet 3  . valACYclovir (VALTREX) 1000 MG tablet Take 1,000 mg by mouth 2 (two) times daily.     No current facility-administered medications for this visit.     Allergies as of 01/20/2017 - Review Complete 01/20/2017  Allergen Reaction Noted  . Thimerosal Other (See Comments) 06/26/2014    Family History  Problem Relation Age of Onset  . Heart disease Mother 79  . Hypertension Mother   . Cancer Father        multiple myeloma  . Heart disease Father 7  . Hypertension Father   . Hypertension Brother   . Colon polyps Brother   . Colon cancer Maternal Grandmother   . Esophageal cancer Neg Hx   . Rectal cancer Neg Hx   . Stomach cancer Neg Hx     Social History   Socioeconomic History  . Marital status: Married  Spouse name: Not on file  . Number of children: Not on file  . Years of education: Not on file  . Highest education level: Not on file  Social Needs  . Financial resource strain: Not on file  . Food insecurity - worry: Not on file  . Food insecurity - inability: Not on file  . Transportation needs - medical: Not on file  . Transportation needs - non-medical: Not on file  Occupational History  . Not on file  Tobacco Use  . Smoking status: Never Smoker  . Smokeless tobacco: Never Used  Substance and Sexual Activity  . Alcohol use: Yes    Comment: 2 beers 1-2 times per week  . Drug use: No  . Sexual activity: Not on file  Other Topics Concern  . Not on file  Social History Narrative   Work or School: Estate manager/land agent Situation: lives with  wife and 3 children - 19/17/15      Spiritual Beliefs: Catholic      Lifestyle: goes to the gym 3-4 days per week and plays tennis; diet is healthy             Review of Systems:    Constitutional: No weight loss, fever or chills Cardiovascular: No chest pain  Respiratory: No SOB  Gastrointestinal: See HPI and otherwise negative   Physical Exam:  Vital signs: BP 110/78   Pulse 68   Ht 5' 10" (1.778 m)   Wt 206 lb 6.4 oz (93.6 kg)   SpO2 98%   BMI 29.62 kg/m   Constitutional:   Pleasant Caucasian male appears to be in NAD, Well developed, Well nourished, alert and cooperative Respiratory: Respirations even and unlabored. Lungs clear to auscultation bilaterally.   No wheezes, crackles, or rhonchi.  Cardiovascular: Normal S1, S2. No MRG. Regular rate and rhythm. No peripheral edema, cyanosis or pallor.  Gastrointestinal:  Soft, nondistended, nontender. No rebound or guarding. Normal bowel sounds. No appreciable masses or hepatomegaly. Psychiatric:  Demonstrates good judgement and reason without abnormal affect or behaviors.  MOST RECENT LABS AND IMAGING: CBC    Component Value Date/Time   WBC 6.3 01/01/2017 0957   RBC 4.66 01/01/2017 0957   HGB 15.9 01/01/2017 0957   HCT 46.3 01/01/2017 0957   PLT 231.0 01/01/2017 0957   MCV 99.4 01/01/2017 0957   MCHC 34.3 01/01/2017 0957   RDW 12.2 01/01/2017 0957   LYMPHSABS 1.5 01/01/2017 0957   MONOABS 0.6 01/01/2017 0957   EOSABS 0.1 01/01/2017 0957   BASOSABS 0.0 01/01/2017 0957    CMP     Component Value Date/Time   NA 139 01/01/2017 0957   K 4.3 01/01/2017 0957   CL 103 01/01/2017 0957   CO2 29 01/01/2017 0957   GLUCOSE 107 (H) 01/01/2017 0957   BUN 15 01/01/2017 0957   CREATININE 1.04 01/01/2017 0957   CALCIUM 9.6 01/01/2017 0957   PROT 7.2 01/01/2017 0957   ALBUMIN 4.5 01/01/2017 0957   AST 18 01/01/2017 0957   ALT 21 01/01/2017 0957   ALKPHOS 65 01/01/2017 0957   BILITOT 0.7 01/01/2017 0957   EXAM: CT  ABDOMEN AND PELVIS WITHOUT CONTRAST 01/08/17  TECHNIQUE: Multidetector CT imaging of the abdomen and pelvis was performed following the standard protocol without IV contrast.  COMPARISON:  None.  FINDINGS: Lower chest: Lung bases are clear. No effusions. Heart is normal size.  Hepatobiliary: Scattered hypodensities in the liver, likely small cysts. Gallbladder unremarkable.  Pancreas: No  focal abnormality or ductal dilatation.  Spleen: No focal abnormality.  Normal size.  Adrenals/Urinary Tract: No adrenal abnormality. No focal renal abnormality. No stones or hydronephrosis. Urinary bladder is unremarkable.  Stomach/Bowel: Moderate stool burden in the colon. Stomach, large and small bowel grossly unremarkable.  Vascular/Lymphatic: No evidence of aortic aneurysm. There are mildly prominent left mesenteric lymph nodes with haziness throughout the adjacent left mesentery. Index left mesenteric lymph node has a short axis diameter of 8 mm on image 29. No retroperitoneal adenopathy.  Reproductive: Mildly prominent prostate.  Other: No free fluid or free air.  Musculoskeletal: No acute bony abnormality.  IMPRESSION: Mildly prominent left abdominal mesenteric lymph nodes with surrounding mesenteric stranding. 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 This appearance can be also seen with lymphoproliferative disorders such as lymphoma. This could be followed with repeat CT in 3-6 months.   Electronically Signed   By: Rolm Baptise M.D.   On: 01/08/2017 12:12  Assessment: 1.  Abnormal CT of the abdomen and pelvis: See above questioning mesenteric panniculitis or adenitis, patient was complaining of some left lower quadrant pain and was started on Cipro and Flagyl, pain has improved, recent colonoscopy in October of this year normal 2.  Left lower quadrant pain: With Above  Plan: 1.  Patient improved after antibiotics. 2.  Patient with normal  colonoscopy in October of this year. 3.  Recommend the patient follow with his primary care provider regarding repeat CT in 3-6 months. 4.  Patient may return to clinic as needed in the future with Dr. Ardis Hughs or myself.  Ellouise Newer, PA-C [facility] 01/20/2017, 9:52 AM  Cc: Biagio Borg, MD

## 2017-01-20 NOTE — Progress Notes (Signed)
I agree with the above note, plan 

## 2017-03-13 ENCOUNTER — Other Ambulatory Visit: Payer: Self-pay | Admitting: Internal Medicine

## 2017-04-17 ENCOUNTER — Other Ambulatory Visit: Payer: Self-pay | Admitting: Internal Medicine

## 2017-08-14 ENCOUNTER — Other Ambulatory Visit: Payer: Self-pay | Admitting: Internal Medicine

## 2017-08-14 ENCOUNTER — Encounter: Payer: Self-pay | Admitting: Internal Medicine

## 2017-08-16 MED ORDER — ROSUVASTATIN CALCIUM 10 MG PO TABS: 10.0000 mg | ORAL_TABLET | Freq: Every day | ORAL | 0 refills | Status: DC

## 2017-09-03 ENCOUNTER — Encounter: Payer: Self-pay | Admitting: Internal Medicine

## 2017-09-03 ENCOUNTER — Ambulatory Visit (INDEPENDENT_AMBULATORY_CARE_PROVIDER_SITE_OTHER): Payer: 59 | Admitting: Internal Medicine

## 2017-09-03 ENCOUNTER — Other Ambulatory Visit (INDEPENDENT_AMBULATORY_CARE_PROVIDER_SITE_OTHER): Payer: 59

## 2017-09-03 VITALS — BP 138/90 | HR 67 | Ht 70.0 in | Wt 214.0 lb

## 2017-09-03 DIAGNOSIS — R739 Hyperglycemia, unspecified: Secondary | ICD-10-CM

## 2017-09-03 DIAGNOSIS — Z0000 Encounter for general adult medical examination without abnormal findings: Secondary | ICD-10-CM

## 2017-09-03 DIAGNOSIS — Z114 Encounter for screening for human immunodeficiency virus [HIV]: Secondary | ICD-10-CM

## 2017-09-03 DIAGNOSIS — R030 Elevated blood-pressure reading, without diagnosis of hypertension: Secondary | ICD-10-CM | POA: Diagnosis not present

## 2017-09-03 LAB — URINALYSIS, ROUTINE W REFLEX MICROSCOPIC
Bilirubin Urine: NEGATIVE
Hgb urine dipstick: NEGATIVE
Ketones, ur: NEGATIVE
Leukocytes, UA: NEGATIVE
Nitrite: NEGATIVE
Specific Gravity, Urine: <=1.005 — AB (ref 1.000–1.030)
Total Protein, Urine: NEGATIVE
Urine Glucose: NEGATIVE
Urobilinogen, UA: 0.2 (ref 0.0–1.0)
WBC, UA: NONE SEEN — AB
pH: 6 (ref 5.0–8.0)

## 2017-09-03 LAB — BASIC METABOLIC PANEL WITH GFR
BUN: 13 mg/dL (ref 6–23)
CO2: 29 meq/L (ref 19–32)
Calcium: 9.5 mg/dL (ref 8.4–10.5)
Chloride: 104 meq/L (ref 96–112)
Creatinine, Ser: 0.99 mg/dL (ref 0.40–1.50)
GFR: 83.47 mL/min
Glucose, Bld: 113 mg/dL — ABNORMAL HIGH (ref 70–99)
Potassium: 4.3 meq/L (ref 3.5–5.1)
Sodium: 141 meq/L (ref 135–145)

## 2017-09-03 LAB — CBC WITH DIFFERENTIAL/PLATELET
Basophils Absolute: 0 10*3/uL (ref 0.0–0.1)
Basophils Relative: 0.5 % (ref 0.0–3.0)
Eosinophils Absolute: 0.1 10*3/uL (ref 0.0–0.7)
Eosinophils Relative: 2.5 % (ref 0.0–5.0)
HCT: 46.6 % (ref 39.0–52.0)
Hemoglobin: 16.4 g/dL (ref 13.0–17.0)
Lymphocytes Relative: 34.8 % (ref 12.0–46.0)
Lymphs Abs: 1.7 10*3/uL (ref 0.7–4.0)
MCHC: 35.1 g/dL (ref 30.0–36.0)
MCV: 93.3 fl (ref 78.0–100.0)
Monocytes Absolute: 0.4 10*3/uL (ref 0.1–1.0)
Monocytes Relative: 8.8 % (ref 3.0–12.0)
Neutro Abs: 2.6 10*3/uL (ref 1.4–7.7)
Neutrophils Relative %: 53.4 % (ref 43.0–77.0)
Platelets: 206 10*3/uL (ref 150.0–400.0)
RBC: 4.99 Mil/uL (ref 4.22–5.81)
RDW: 12 % (ref 11.5–15.5)
WBC: 4.8 10*3/uL (ref 4.0–10.5)

## 2017-09-03 LAB — LIPID PANEL
Cholesterol: 140 mg/dL (ref 0–200)
HDL: 54.4 mg/dL
LDL Cholesterol: 75 mg/dL (ref 0–99)
NonHDL: 85.45
Total CHOL/HDL Ratio: 3
Triglycerides: 51 mg/dL (ref 0.0–149.0)
VLDL: 10.2 mg/dL (ref 0.0–40.0)

## 2017-09-03 LAB — PSA: PSA: 1.11 ng/mL (ref 0.10–4.00)

## 2017-09-03 LAB — TSH: TSH: 2.93 u[IU]/mL (ref 0.35–4.50)

## 2017-09-03 LAB — HEPATIC FUNCTION PANEL
ALBUMIN: 4.7 g/dL (ref 3.5–5.2)
ALK PHOS: 92 U/L (ref 39–117)
ALT: 36 U/L (ref 0–53)
AST: 26 U/L (ref 0–37)
Bilirubin, Direct: 0.1 mg/dL (ref 0.0–0.3)
Total Bilirubin: 0.5 mg/dL (ref 0.2–1.2)
Total Protein: 7.3 g/dL (ref 6.0–8.3)

## 2017-09-03 LAB — HEMOGLOBIN A1C: Hgb A1c MFr Bld: 5.4 % (ref 4.6–6.5)

## 2017-09-03 NOTE — Assessment & Plan Note (Signed)
Minor, for a1c with labs, diet and wt control

## 2017-09-03 NOTE — Patient Instructions (Signed)
Please monitor your blood pressure on a regular basis at home, with the goal being less than 130/80, and let us know in a few wks if it appears your BP at home is even mildly elevated on average  Please continue all other medications as before, and refills have been done if requested.  Please have the pharmacy call with any other refills you may need.  Please continue your efforts at being more active, low cholesterol diet, and weight control.  You are otherwise up to date with prevention measures today.  Please keep your appointments with your specialists as you may have planned  Please go to the LAB in the Basement (turn left off the elevator) for the tests to be done today  You will be contacted by phone if any changes need to be made immediately.  Otherwise, you will receive a letter about your results with an explanation, but please check with MyChart first.  Please remember to sign up for MyChart if you have not done so, as this will be important to you in the future with finding out test results, communicating by private email, and scheduling acute appointments online when needed.  Please return in 1 year for your yearly visit, or sooner if needed, with Lab testing done 3-5 days before

## 2017-09-03 NOTE — Progress Notes (Signed)
Subjective:    Patient ID: Glenn Bowman, male    DOB: 1960-07-23, 57 y.o.   MRN: [id number]  HPI   Here for wellness and f/u;  Overall doing ok;  Pt denies Chest pain, worsening SOB, DOE, wheezing, orthopnea, PND, worsening LE edema, palpitations, dizziness or syncope.  Pt denies neurological change such as new headache, facial or extremity weakness.  Pt denies polydipsia, polyuria, or low sugar symptoms. Pt states overall good compliance with treatment and medications, good tolerability, and has been trying to follow appropriate diet.  Pt denies worsening depressive symptoms, suicidal ideation or panic. No fever, night sweats, wt loss, loss of appetite, or other constitutional symptoms.  Pt states good ability with ADL's, has low fall risk, home safety reviewed and adequate, no other significant changes in hearing or vision, and fairly active with exercise with tennis several times per wk. Abd pain resolved from last visit, was having some nausea with doxycycline but better with taking with food and less coffee.   Past Medical History:  Diagnosis Date  . Allergic rhinitis   . Allergy   . Hyperlipidemia   . Squamous cell skin cancer    Past Surgical History:  Procedure Laterality Date  . MOHS SURGERY    . TOOTH EXTRACTION      reports that he has never smoked. He has never used smokeless tobacco. He reports that he drinks alcohol. He reports that he does not use drugs. family history includes Cancer in his father; Colon cancer in his maternal grandmother; Colon polyps in his brother; Heart disease (age of onset: 24) in his father; Heart disease (age of onset: 27) in his mother; Hypertension in his brother, father, and mother. Allergies  Allergen Reactions  . Thimerosal Other (See Comments)    Ocular irritation   Current Outpatient Medications on File Prior to Visit  Medication Sig Dispense Refill  . aspirin EC 81 MG tablet Take 1 tablet (81 mg total) by mouth daily. 90 tablet 11  .  doxycycline (PERIOSTAT) 20 MG tablet Take 20 mg by mouth 2 (two) times daily.    . rosuvastatin (CRESTOR) 10 MG tablet Take 1 tablet (10 mg total) by mouth daily. **PATIENT NEEDS PHYSICAL FOR ADDITIONAL REFILLS** 30 tablet 0  . valACYclovir (VALTREX) 1000 MG tablet Take 1,000 mg by mouth every other day.      No current facility-administered medications on file prior to visit.    Review of Systems Constitutional: Negative for other unusual diaphoresis, sweats, appetite or weight changes HENT: Negative for other worsening hearing loss, ear pain, facial swelling, mouth sores or neck stiffness.   Eyes: Negative for other worsening pain, redness or other visual disturbance.  Respiratory: Negative for other stridor or swelling Cardiovascular: Negative for other palpitations or other chest pain  Gastrointestinal: Negative for worsening diarrhea or loose stools, blood in stool, distention or other pain Genitourinary: Negative for hematuria, flank pain or other change in urine volume.  Musculoskeletal: Negative for myalgias or other joint swelling.  Skin: Negative for other color change, or other wound or worsening drainage.  Neurological: Negative for other syncope or numbness. Hematological: Negative for other adenopathy or swelling Psychiatric/Behavioral: Negative for hallucinations, other worsening agitation, SI, self-injury, or new decreased concentration All other system neg per pt    Objective:   Physical Exam BP 138/90 (BP Location: Left Arm, Patient Position: Sitting, Cuff Size: Normal)   Pulse 67   Ht 5\' 10"  (1.778 m)   Wt 214 lb (97.1 kg)  SpO2 99%   BMI 30.71 kg/m  VS noted,  Constitutional: Pt is oriented to person, place, and time. Appears well-developed and well-nourished, in no significant distress and comfortable Head: Normocephalic and atraumatic  Eyes: Conjunctivae and EOM are normal. Pupils are equal, round, and reactive to light Right Ear: External ear normal without  discharge Left Ear: External ear normal without discharge Nose: Nose without discharge or deformity Mouth/Throat: Oropharynx is without other ulcerations and moist  Neck: Normal range of motion. Neck supple. No JVD present. No tracheal deviation present or significant neck LA or mass Cardiovascular: Normal rate, regular rhythm, normal heart sounds and intact distal pulses.   Pulmonary/Chest: WOB normal and breath sounds without rales or wheezing  Abdominal: Soft. Bowel sounds are normal. NT. No HSM  Musculoskeletal: Normal range of motion. Exhibits no edema Lymphadenopathy: Has no other cervical adenopathy.  Neurological: Pt is alert and oriented to person, place, and time. Pt has normal reflexes. No cranial nerve deficit. Motor grossly intact, Gait intact Skin: Skin is warm and dry. No rash noted or new ulcerations Psychiatric:  Has normal mood and affect. Behavior is normal without agitation No other exam findings Lab Results  Component Value Date   WBC 6.3 01/01/2017   HGB 15.9 01/01/2017   HCT 46.3 01/01/2017   PLT 231.0 01/01/2017   GLUCOSE 107 (H) 01/01/2017   CHOL 205 (H) 03/20/2016   TRIG 54.0 03/20/2016   HDL 55.50 03/20/2016   LDLCALC 138 (H) 03/20/2016   ALT 21 01/01/2017   AST 18 01/01/2017   NA 139 01/01/2017   K 4.3 01/01/2017   CL 103 01/01/2017   CREATININE 1.04 01/01/2017   BUN 15 01/01/2017   CO2 29 01/01/2017   TSH 2.13 01/01/2017   PSA 0.96 03/20/2016   HGBA1C 5.1 07/04/2013       Assessment & Plan:

## 2017-09-03 NOTE — Assessment & Plan Note (Signed)
Mild, for f/u BP at home, and call for ave BP > 130/80

## 2017-09-04 LAB — HIV ANTIBODY (ROUTINE TESTING W REFLEX): HIV 1&2 Ab, 4th Generation: NONREACTIVE

## 2017-09-07 MED ORDER — ROSUVASTATIN CALCIUM 10 MG PO TABS: 10.0000 mg | ORAL_TABLET | Freq: Every day | ORAL | 1 refills | Status: DC

## 2017-09-11 ENCOUNTER — Other Ambulatory Visit: Payer: Self-pay | Admitting: Internal Medicine

## 2018-08-06 IMAGING — CT CT ABD-PELV W/O CM
2 of 4 series · 16 of 46 positions shown, 18 images · non-contrast
Comparison: None.

CLINICAL DATA: Left lower quadrant pain

EXAM:
CT ABDOMEN AND PELVIS WITHOUT CONTRAST
TECHNIQUE: Multidetector CT imaging of the abdomen and pelvis was performed
following the standard protocol without IV contrast.

[Series 2: abd/ pelvis · axial · 0.79mm/px · z∈[-522,-72]mm · 13 of 98 slices shown, 15 images]
[im 4/98  soft-tissue]
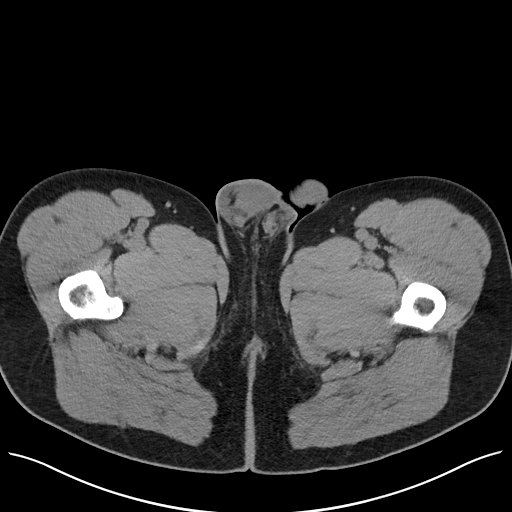
[im 4/98  bone]
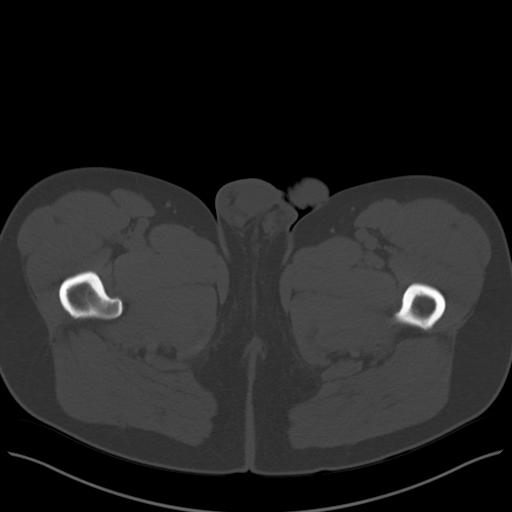
[im 12/98  soft-tissue]
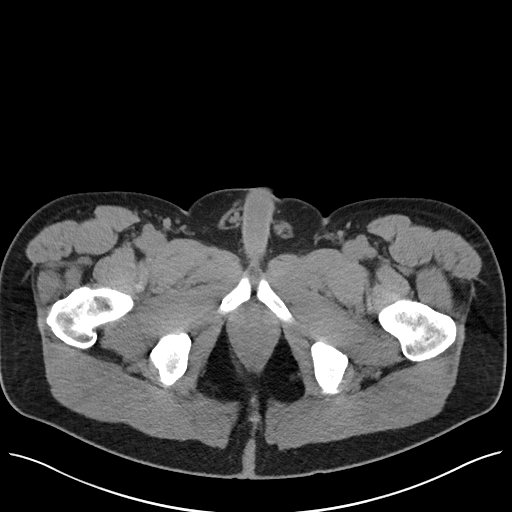
[im 20/98  soft-tissue]
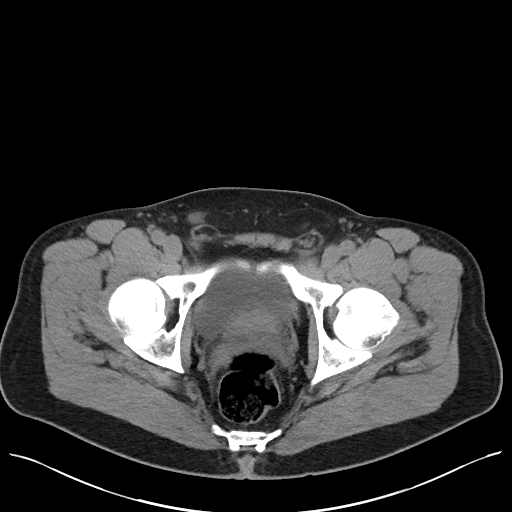
[im 28/98  soft-tissue]
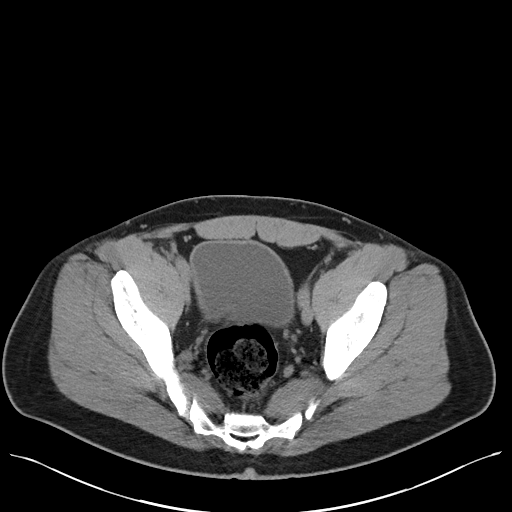
[im 35/98  soft-tissue]
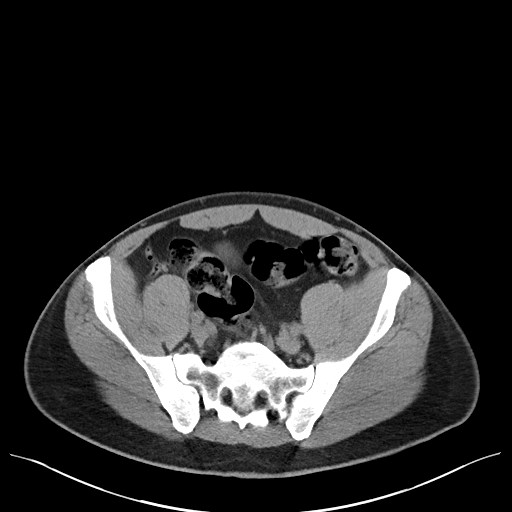
[im 43/98  soft-tissue]
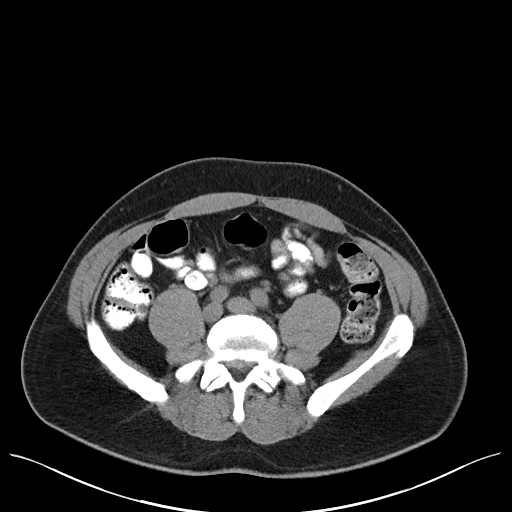
[im 51/98  soft-tissue]
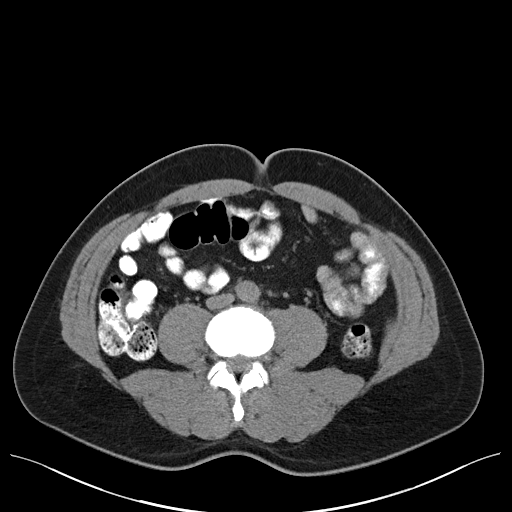
[im 55/98  soft-tissue]
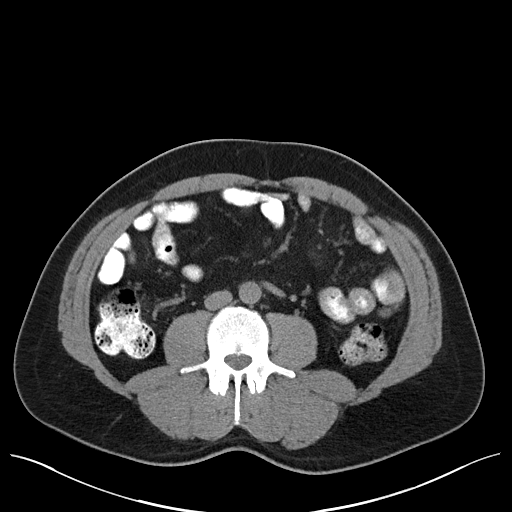
[im 63/98  soft-tissue]
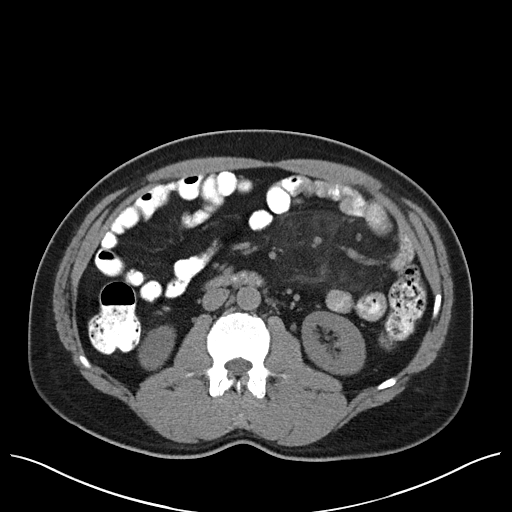
[im 63/98  bone]
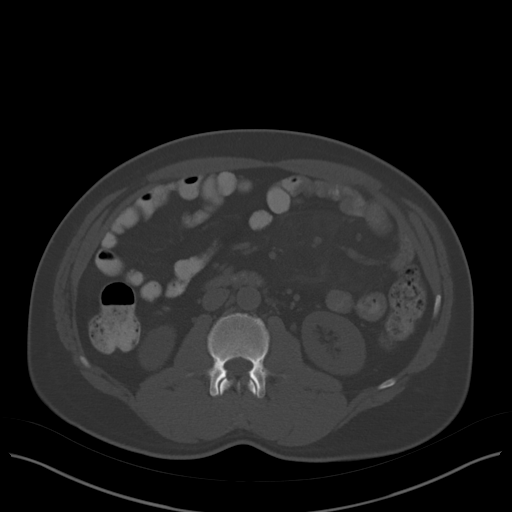
[im 70/98  soft-tissue]
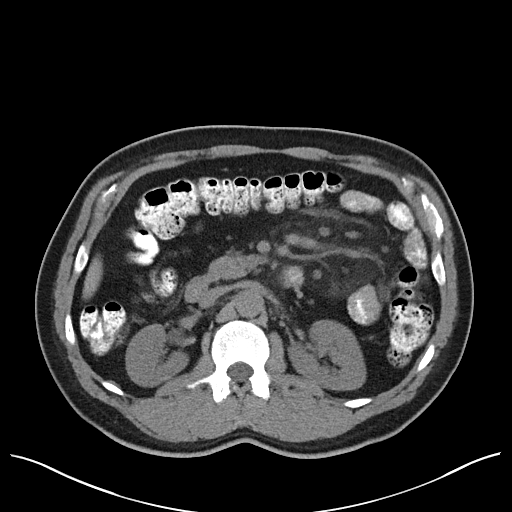
[im 78/98  soft-tissue]
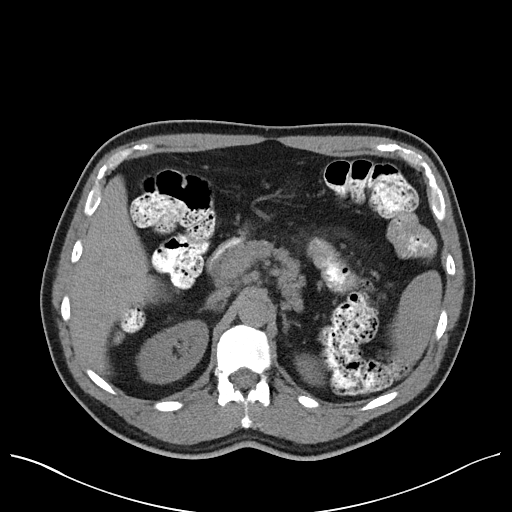
[im 86/98  soft-tissue]
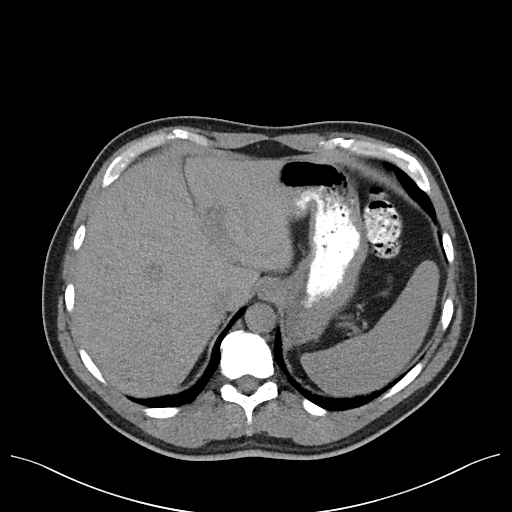
[im 94/98  soft-tissue]
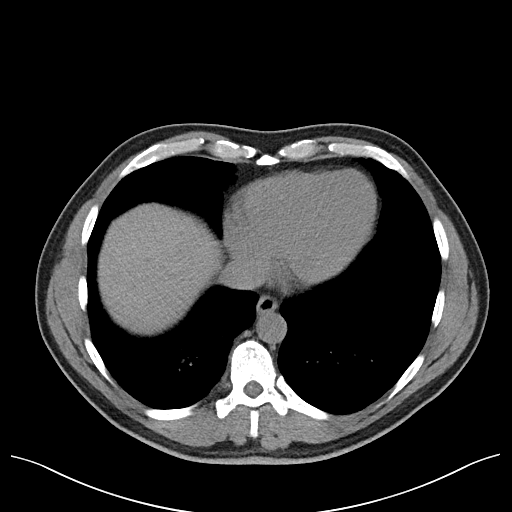

[Series 5: cor st · coronal · 0.74mm/px · 3 of 86 slices shown]
[im 29/86  soft-tissue]
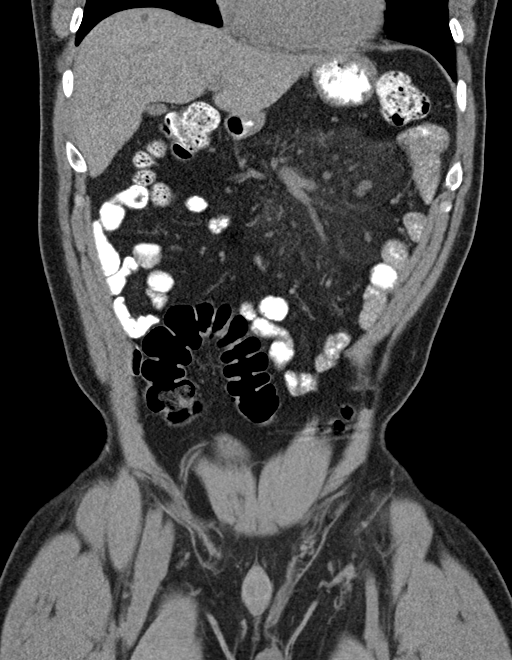
[im 38/86  soft-tissue]
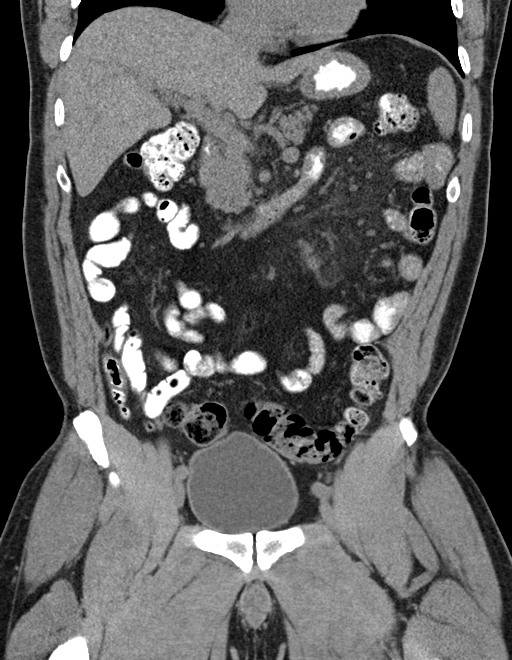
[im 48/86  soft-tissue]
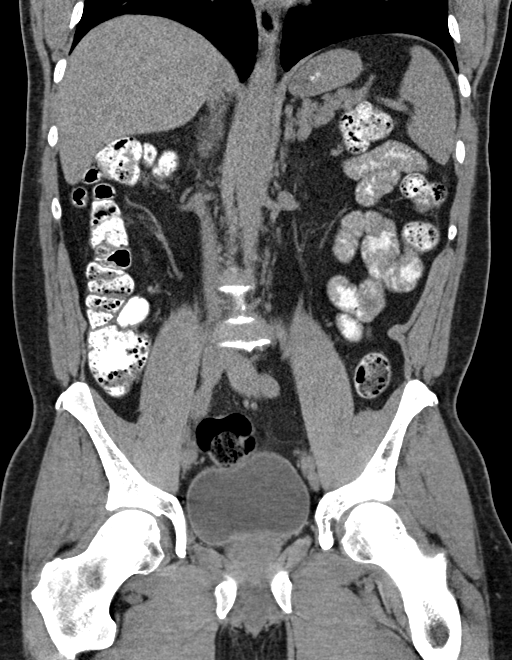

[16 of 46 positions shown; findings below may reference images not displayed]

FINDINGS: Lower chest: Lung bases are clear. No effusions. Heart is normal
size.

Hepatobiliary: Scattered hypodensities in the liver, likely small
cysts. Gallbladder unremarkable.

Pancreas: No focal abnormality or ductal dilatation.

Spleen: No focal abnormality.  Normal size.

Adrenals/Urinary Tract: No adrenal abnormality. No focal renal
abnormality. No stones or hydronephrosis. Urinary bladder is
unremarkable.

Stomach/Bowel: Moderate stool burden in the colon. Stomach, large
and small bowel grossly unremarkable.

Vascular/Lymphatic: No evidence of aortic aneurysm. There are mildly
prominent left mesenteric lymph nodes with haziness throughout the
adjacent left mesentery. Index left mesenteric lymph node has a
short axis diameter of 8 mm on image 29. No retroperitoneal
adenopathy.

Reproductive: Mildly prominent prostate.

Other: No free fluid or free air.

Musculoskeletal: No acute bony abnormality.
IMPRESSION: Mildly prominent left abdominal mesenteric lymph nodes with
surrounding mesenteric stranding. This could reflect mesenteric
panniculitis or adenitis. This appearance can be also seen with
lymphoproliferative disorders such as lymphoma. This could be
followed with repeat CT in 3-6 months.

## 2018-08-16 ENCOUNTER — Ambulatory Visit (INDEPENDENT_AMBULATORY_CARE_PROVIDER_SITE_OTHER)
Admission: RE | Admit: 2018-08-16 | Discharge: 2018-08-16 | Disposition: A | Discharge status: Home / Self Care | Payer: 59 | Source: Ambulatory Visit

## 2018-08-16 DIAGNOSIS — H9201 Otalgia, right ear: Secondary | ICD-10-CM

## 2018-08-16 MED ORDER — CIPROFLOXACIN-DEXAMETHASONE 0.3-0.1 % OT SUSP: 4.0000 [drp] | Freq: Two times a day (BID) | OTIC | 0 refills | Status: DC

## 2018-08-16 NOTE — Discharge Instructions (Signed)
Use ciprodex drops twice daily for 1 week Follow up if symptoms not improving or worsening

## 2018-08-16 NOTE — ED Provider Notes (Signed)
Virtual Visit via Video Note:  Glenn Bowman  initiated request for Telemedicine visit with [facility] Urgent Care team. I connected with Glenn Bowman  on 08/16/2018 at 4:54 PM  for a synchronized telemedicine visit using a video enabled HIPPA compliant telemedicine application. I verified that I am speaking with Glenn Bowman  using two identifiers. Glenn Bowman Glenn Yousef Huge, PA-Glenn  was physically located in a [facility] Urgent care site and Glenn Bowman was located at a different location.   The limitations of evaluation and management by telemedicine as well as the availability of in-person appointments were discussed. Patient was informed that he  may incur a bill ( including co-pay) for this virtual visit encounter. Glenn Bowman  expressed understanding and gave verbal consent to proceed with virtual visit.     History of Present Illness:Glenn Bowman  is a 58 y.o. male presents with concern over possible ear infection.  Patient notes that of recently he has been swimming frequently.  Beginning yesterday he started to develop discomfort in his right ear.  He has noticed discomfort and tenderness with touching the outer aspect of his ear.  Denies any drainage.  Denies any changes to hearing.  Has had some slight discomfort in ear with swallowing as well.  Denies sore throat.  Denies fevers chills or body aches.  Denies cough.  Past Medical History:  Diagnosis Date  . Allergic rhinitis   . Allergy   . Hyperlipidemia   . Squamous cell skin cancer     Allergies  Allergen Reactions  . Thimerosal Other (See Comments)    Ocular irritation        Observations/Objective:  Physical Exam  Constitutional: He is oriented to person, place, and time and well-developed, well-nourished, and in no distress. No distress.  HENT:  Head: Normocephalic and atraumatic.  Right Ear: External ear normal.  Left Ear: External ear normal.  Points to right ear and has tenderness to touch  Eyes: Conjunctivae are normal.   Wearing glasses  Neck: Normal range of motion.  Pulmonary/Chest: Effort normal. No respiratory distress.  Speaking in full sentences  Musculoskeletal:     Comments: Using extremities appropriately  Neurological: He is alert and oriented to person, place, and time.  Psychiatric: Affect normal.    Assessment and Plan: Patient with right ear pain, possible otitis externa.  Will treat with eardrops to cover for infection.  Prescribed Ciprodex drops as Cortisporin drops had cross-reactivity with reported thimerosal allergy.  Advised if drops too expensive may call, would switch to acetic acid drops.  Follow-up in person if pain not improving or worsening, changing, developing swelling or fevers.  Follow Up Instructions:    I discussed the assessment and treatment plan with the patient. The patient was provided an opportunity to ask questions and all were answered. The patient agreed with the plan and demonstrated an understanding of the instructions.   The patient was advised to call back or seek an in-person evaluation if the symptoms worsen or if the condition fails to improve as anticipated.     Janith Lima, PA-Glenn  08/16/2018 4:54 PM        Janith Lima, PA-Glenn 08/16/18 1914

## 2018-09-23 ENCOUNTER — Other Ambulatory Visit: Payer: Self-pay | Admitting: Internal Medicine

## 2018-09-23 ENCOUNTER — Encounter: Payer: Self-pay | Admitting: Internal Medicine

## 2018-09-26 ENCOUNTER — Telehealth: Payer: Self-pay | Admitting: Internal Medicine

## 2018-09-26 MED ORDER — ROSUVASTATIN CALCIUM 10 MG PO TABS: 10.0000 mg | ORAL_TABLET | Freq: Every day | ORAL | 0 refills | Status: DC

## 2018-09-26 NOTE — Telephone Encounter (Signed)
Medication Refill - Medication: rosuvastatin (CRESTOR) 10 MG tablet    Has the patient contacted their pharmacy? Yes.     (Agent: If yes, when and what did the pharmacy advise?) Pt contacted pharmacy and they told him that the prescription had run out.   Preferred Pharmacy (with phone number or street name):  [facility] [address] [place] [phone] (Phone) [phone] (Fax)     Agent: Please be advised that RX refills may take up to 3 business days. We ask that you follow-up with your pharmacy.

## 2018-09-27 NOTE — Telephone Encounter (Signed)
Left message for patient to call back to schedule.  °

## 2018-12-22 ENCOUNTER — Other Ambulatory Visit: Payer: Self-pay | Admitting: Internal Medicine

## 2018-12-27 ENCOUNTER — Other Ambulatory Visit: Payer: Self-pay

## 2018-12-27 ENCOUNTER — Ambulatory Visit (INDEPENDENT_AMBULATORY_CARE_PROVIDER_SITE_OTHER): Payer: 59 | Admitting: Internal Medicine

## 2018-12-27 ENCOUNTER — Other Ambulatory Visit (INDEPENDENT_AMBULATORY_CARE_PROVIDER_SITE_OTHER): Payer: 59

## 2018-12-27 ENCOUNTER — Encounter: Payer: Self-pay | Admitting: Internal Medicine

## 2018-12-27 VITALS — BP 128/76 | HR 67 | Temp 97.6°F | Wt 214.2 lb

## 2018-12-27 DIAGNOSIS — Z0000 Encounter for general adult medical examination without abnormal findings: Secondary | ICD-10-CM | POA: Diagnosis not present

## 2018-12-27 DIAGNOSIS — R739 Hyperglycemia, unspecified: Secondary | ICD-10-CM | POA: Diagnosis not present

## 2018-12-27 DIAGNOSIS — E611 Iron deficiency: Secondary | ICD-10-CM

## 2018-12-27 DIAGNOSIS — E538 Deficiency of other specified B group vitamins: Secondary | ICD-10-CM | POA: Diagnosis not present

## 2018-12-27 DIAGNOSIS — E559 Vitamin D deficiency, unspecified: Secondary | ICD-10-CM | POA: Diagnosis not present

## 2018-12-27 DIAGNOSIS — Z23 Encounter for immunization: Secondary | ICD-10-CM | POA: Diagnosis not present

## 2018-12-27 LAB — URINALYSIS, ROUTINE W REFLEX MICROSCOPIC
Bilirubin Urine: NEGATIVE
Hgb urine dipstick: NEGATIVE
Ketones, ur: NEGATIVE
Leukocytes,Ua: NEGATIVE
Nitrite: NEGATIVE
RBC / HPF: NONE SEEN (ref 0–?)
Specific Gravity, Urine: <=1.005 — AB (ref 1.000–1.030)
Total Protein, Urine: NEGATIVE
Urine Glucose: NEGATIVE
Urobilinogen, UA: 0.2 (ref 0.0–1.0)
WBC, UA: NONE SEEN (ref 0–?)
pH: 7 (ref 5.0–8.0)

## 2018-12-27 LAB — HEPATIC FUNCTION PANEL
ALT: 21 U/L (ref 0–53)
AST: 18 U/L (ref 0–37)
Albumin: 4.7 g/dL (ref 3.5–5.2)
Alkaline Phosphatase: 79 U/L (ref 39–117)
Bilirubin, Direct: 0.1 mg/dL (ref 0.0–0.3)
Total Bilirubin: 0.5 mg/dL (ref 0.2–1.2)
Total Protein: 6.9 g/dL (ref 6.0–8.3)

## 2018-12-27 LAB — CBC WITH DIFFERENTIAL/PLATELET
Basophils Absolute: 0 10*3/uL (ref 0.0–0.1)
Basophils Relative: 0.5 % (ref 0.0–3.0)
Eosinophils Absolute: 0.1 10*3/uL (ref 0.0–0.7)
Eosinophils Relative: 3 % (ref 0.0–5.0)
HCT: 45.1 % (ref 39.0–52.0)
Hemoglobin: 15.3 g/dL (ref 13.0–17.0)
Lymphocytes Relative: 35.9 % (ref 12.0–46.0)
Lymphs Abs: 1.7 10*3/uL (ref 0.7–4.0)
MCHC: 34 g/dL (ref 30.0–36.0)
MCV: 91.7 fl (ref 78.0–100.0)
Monocytes Absolute: 0.4 10*3/uL (ref 0.1–1.0)
Monocytes Relative: 8.4 % (ref 3.0–12.0)
Neutro Abs: 2.5 10*3/uL (ref 1.4–7.7)
Neutrophils Relative %: 52.2 % (ref 43.0–77.0)
Platelets: 177 10*3/uL (ref 150.0–400.0)
RBC: 4.92 Mil/uL (ref 4.22–5.81)
RDW: 12.5 % (ref 11.5–15.5)
WBC: 4.8 10*3/uL (ref 4.0–10.5)

## 2018-12-27 LAB — IBC PANEL
Iron: 72 ug/dL (ref 42–165)
Saturation Ratios: 18.1 % — ABNORMAL LOW (ref 20.0–50.0)
Transferrin: 284 mg/dL (ref 212.0–360.0)

## 2018-12-27 LAB — LIPID PANEL
Cholesterol: 134 mg/dL (ref 0–200)
HDL: 51.6 mg/dL (ref >39.00)
LDL Cholesterol: 68 mg/dL (ref 0–99)
NonHDL: 82.5
Total CHOL/HDL Ratio: 3
Triglycerides: 71 mg/dL (ref 0.0–149.0)
VLDL: 14.2 mg/dL (ref 0.0–40.0)

## 2018-12-27 LAB — BASIC METABOLIC PANEL
BUN: 14 mg/dL (ref 6–23)
CO2: 31 mEq/L (ref 19–32)
Calcium: 8.8 mg/dL (ref 8.4–10.5)
Chloride: 102 mEq/L (ref 96–112)
Creatinine, Ser: 0.96 mg/dL (ref 0.40–1.50)
GFR: 80.98 mL/min (ref >60.00)
Glucose, Bld: 94 mg/dL (ref 70–99)
Potassium: 3.7 mEq/L (ref 3.5–5.1)
Sodium: 140 mEq/L (ref 135–145)

## 2018-12-27 LAB — HEMOGLOBIN A1C: Hgb A1c MFr Bld: 5.3 % (ref 4.6–6.5)

## 2018-12-27 LAB — TSH: TSH: 2.32 u[IU]/mL (ref 0.35–4.50)

## 2018-12-27 LAB — PSA: PSA: 0.93 ng/mL (ref 0.10–4.00)

## 2018-12-27 LAB — VITAMIN D 25 HYDROXY (VIT D DEFICIENCY, FRACTURES): VITD: 40.77 ng/mL (ref 30.00–100.00)

## 2018-12-27 LAB — VITAMIN B12: Vitamin B-12: 260 pg/mL (ref 211–911)

## 2018-12-27 MED ORDER — ROSUVASTATIN CALCIUM 10 MG PO TABS: 10.0000 mg | ORAL_TABLET | Freq: Every day | ORAL | 3 refills | Status: DC

## 2018-12-27 NOTE — Assessment & Plan Note (Signed)
stable overall by history and exam, recent data reviewed with pt, and pt to continue medical treatment as before,  to f/u any worsening symptoms or concerns  

## 2018-12-27 NOTE — Assessment & Plan Note (Signed)

## 2018-12-27 NOTE — Progress Notes (Signed)
Subjective:    Patient ID: Glenn Bowman, male    DOB: June 02, 1960, 58 y.o.   MRN: [id number]  HPI  Here for wellness and f/u;  Overall doing ok;  Pt denies Chest pain, worsening SOB, DOE, wheezing, orthopnea, PND, worsening LE edema, palpitations, dizziness or syncope.  Pt denies neurological change such as new headache, facial or extremity weakness.  Pt denies polydipsia, polyuria, or low sugar symptoms. Pt states overall good compliance with treatment and medications, good tolerability, and has been trying to follow appropriate diet.  Pt denies worsening depressive symptoms, suicidal ideation or panic. No fever, night sweats, wt loss, loss of appetite, or other constitutional symptoms.  Pt states good ability with ADL's, has low fall risk, home safety reviewed and adequate, no other significant changes in hearing or vision, and occasionally active with exercise with pickleball and walking  No new complaints. 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 Wt Readings from Last 3 Encounters:  12/27/18 214 lb 3.2 oz (97.2 kg)  09/03/17 214 lb (97.1 kg)  01/20/17 206 lb 6.4 oz (93.6 kg)   BP Readings from Last 3 Encounters:  12/27/18 128/76  09/03/17 138/90  01/20/17 110/78   Past Medical History:  Diagnosis Date  . Allergic rhinitis   . Allergy   . Hyperlipidemia   . Squamous cell skin cancer    Past Surgical History:  Procedure Laterality Date  . MOHS SURGERY    . TOOTH EXTRACTION      reports that he has never smoked. He has never used smokeless tobacco. He reports current alcohol use. He reports that he does not use drugs. family history includes Cancer in his father; Colon cancer in his maternal grandmother; Colon polyps in his brother; Heart disease (age of onset: 41) in his father; Heart disease (age of onset: 39) in his mother; Hypertension in his brother, father, and mother. Allergies  Allergen Reactions  . Thimerosal Other (See Comments)    Ocular irritation    Current Outpatient Medications on File Prior to Visit  Medication Sig Dispense Refill  . aspirin EC 81 MG tablet Take 1 tablet (81 mg total) by mouth daily. 90 tablet 11  . doxycycline (PERIOSTAT) 20 MG tablet Take 20 mg by mouth 2 (two) times daily.    . rosuvastatin (CRESTOR) 10 MG tablet TAKE ONE TABLET BY MOUTH DAILY 30 tablet 0   No current facility-administered medications on file prior to visit.    Review of Systems Constitutional: Negative for other unusual diaphoresis, sweats, appetite or weight changes HENT: Negative for other worsening hearing loss, ear pain, facial swelling, mouth sores or neck stiffness.   Eyes: Negative for other worsening pain, redness or other visual disturbance.  Respiratory: Negative for other stridor or swelling Cardiovascular: Negative for other palpitations or other chest pain  Gastrointestinal: Negative for worsening diarrhea or loose stools, blood in stool, distention or other pain Genitourinary: Negative for hematuria, flank pain or other change in urine volume.  Musculoskeletal: Negative for myalgias or other joint swelling.  Skin: Negative for other color change, or other wound or worsening drainage.  Neurological: Negative for other syncope or numbness. Hematological: Negative for other adenopathy or swelling Psychiatric/Behavioral: Negative for hallucinations, other worsening agitation, SI, self-injury, or new decreased concentration All otherwise neg per pt    Objective:   Physical Exam BP 128/76 (BP Location: Left Arm)   Pulse 67   Temp 97.6 F (36.4 C) (Oral)   Wt 214  lb 3.2 oz (97.2 kg)   SpO2 99%   BMI 30.73 kg/m  VS noted,  Constitutional: Pt is oriented to person, place, and time. Appears well-developed and well-nourished, in no significant distress and comfortable Head: Normocephalic and atraumatic  Eyes: Conjunctivae and EOM are normal. Pupils are equal, round, and reactive to light Right Ear: External ear normal without  discharge Left Ear: External ear normal without discharge Nose: Nose without discharge or deformity Mouth/Throat: Oropharynx is without other ulcerations and moist  Neck: Normal range of motion. Neck supple. No JVD present. No tracheal deviation present or significant neck LA or mass Cardiovascular: Normal rate, regular rhythm, normal heart sounds and intact distal pulses.   Pulmonary/Chest: WOB normal and breath sounds without rales or wheezing  Abdominal: Soft. Bowel sounds are normal. NT. No HSM  Musculoskeletal: Normal range of motion. Exhibits no edema Lymphadenopathy: Has no other cervical adenopathy.  Neurological: Pt is alert and oriented to person, place, and time. Pt has normal reflexes. No cranial nerve deficit. Motor grossly intact, Gait intact Skin: Skin is warm and dry. No rash noted or new ulcerations Psychiatric:  Has normal mood and affect. Behavior is normal without agitation All otherwise neg per pt  . Lab Results  Component Value Date   WBC 4.8 09/03/2017   HGB 16.4 09/03/2017   HCT 46.6 09/03/2017   PLT 206.0 09/03/2017   GLUCOSE 113 (H) 09/03/2017   CHOL 140 09/03/2017   TRIG 51.0 09/03/2017   HDL 54.40 09/03/2017   LDLCALC 75 09/03/2017   ALT 36 09/03/2017   AST 26 09/03/2017   NA 141 09/03/2017   K 4.3 09/03/2017   CL 104 09/03/2017   CREATININE 0.99 09/03/2017   BUN 13 09/03/2017   CO2 29 09/03/2017   TSH 2.93 09/03/2017   PSA 1.11 09/03/2017   HGBA1C 5.4 09/03/2017          Assessment & Plan:

## 2018-12-27 NOTE — Patient Instructions (Addendum)

## 2018-12-27 NOTE — Addendum Note (Signed)
Addended by: Earnstine Regal on: 12/27/2018 09:55 AM   Modules accepted: Orders

## 2019-09-04 ENCOUNTER — Encounter: Payer: Self-pay | Admitting: Internal Medicine

## 2019-12-30 ENCOUNTER — Other Ambulatory Visit: Payer: Self-pay | Admitting: Internal Medicine

## 2019-12-30 NOTE — Telephone Encounter (Signed)
Please refill as per office routine med refill policy (all routine meds refilled for 3 mo or monthly per pt preference up to one year from last visit, then month to month grace period for 3 mo, then further med refills will have to be denied)  

## 2020-02-08 ENCOUNTER — Other Ambulatory Visit: Payer: Self-pay

## 2020-02-08 ENCOUNTER — Ambulatory Visit (INDEPENDENT_AMBULATORY_CARE_PROVIDER_SITE_OTHER): Payer: 59 | Admitting: Internal Medicine

## 2020-02-08 ENCOUNTER — Encounter: Payer: Self-pay | Admitting: Internal Medicine

## 2020-02-08 VITALS — BP 130/74 | HR 73 | Temp 97.9°F | Ht 70.0 in | Wt 212.2 lb

## 2020-02-08 DIAGNOSIS — R030 Elevated blood-pressure reading, without diagnosis of hypertension: Secondary | ICD-10-CM

## 2020-02-08 DIAGNOSIS — Z0000 Encounter for general adult medical examination without abnormal findings: Secondary | ICD-10-CM | POA: Diagnosis not present

## 2020-02-08 DIAGNOSIS — Z23 Encounter for immunization: Secondary | ICD-10-CM

## 2020-02-08 DIAGNOSIS — E785 Hyperlipidemia, unspecified: Secondary | ICD-10-CM

## 2020-02-08 DIAGNOSIS — R739 Hyperglycemia, unspecified: Secondary | ICD-10-CM

## 2020-02-08 LAB — HEPATIC FUNCTION PANEL
ALT: 23 U/L (ref 0–53)
AST: 23 U/L (ref 0–37)
Albumin: 4.8 g/dL (ref 3.5–5.2)
Alkaline Phosphatase: 82 U/L (ref 39–117)
Bilirubin, Direct: 0.1 mg/dL (ref 0.0–0.3)
Total Bilirubin: 0.7 mg/dL (ref 0.2–1.2)
Total Protein: 7.5 g/dL (ref 6.0–8.3)

## 2020-02-08 LAB — BASIC METABOLIC PANEL
BUN: 15 mg/dL (ref 6–23)
CO2: 27 mEq/L (ref 19–32)
Calcium: 9.6 mg/dL (ref 8.4–10.5)
Chloride: 104 mEq/L (ref 96–112)
Creatinine, Ser: 0.95 mg/dL (ref 0.40–1.50)
GFR: 89 mL/min (ref >60.00)
Glucose, Bld: 94 mg/dL (ref 70–99)
Potassium: 4.2 mEq/L (ref 3.5–5.1)
Sodium: 138 mEq/L (ref 135–145)

## 2020-02-08 LAB — LIPID PANEL
Cholesterol: 163 mg/dL (ref 0–200)
HDL: 60.1 mg/dL (ref >39.00)
LDL Cholesterol: 90 mg/dL (ref 0–99)
NonHDL: 102.89
Total CHOL/HDL Ratio: 3
Triglycerides: 63 mg/dL (ref 0.0–149.0)
VLDL: 12.6 mg/dL (ref 0.0–40.0)

## 2020-02-08 LAB — CBC WITH DIFFERENTIAL/PLATELET
Basophils Absolute: 0 10*3/uL (ref 0.0–0.1)
Basophils Relative: 0.5 % (ref 0.0–3.0)
Eosinophils Absolute: 0.1 10*3/uL (ref 0.0–0.7)
Eosinophils Relative: 3.2 % (ref 0.0–5.0)
HCT: 47.4 % (ref 39.0–52.0)
Hemoglobin: 16.2 g/dL (ref 13.0–17.0)
Lymphocytes Relative: 33.3 % (ref 12.0–46.0)
Lymphs Abs: 1.5 10*3/uL (ref 0.7–4.0)
MCHC: 34.2 g/dL (ref 30.0–36.0)
MCV: 90.9 fl (ref 78.0–100.0)
Monocytes Absolute: 0.4 10*3/uL (ref 0.1–1.0)
Monocytes Relative: 9.7 % (ref 3.0–12.0)
Neutro Abs: 2.4 10*3/uL (ref 1.4–7.7)
Neutrophils Relative %: 53.3 % (ref 43.0–77.0)
Platelets: 197 10*3/uL (ref 150.0–400.0)
RBC: 5.21 Mil/uL (ref 4.22–5.81)
RDW: 12.8 % (ref 11.5–15.5)
WBC: 4.5 10*3/uL (ref 4.0–10.5)

## 2020-02-08 LAB — URINALYSIS, ROUTINE W REFLEX MICROSCOPIC
Bilirubin Urine: NEGATIVE
Hgb urine dipstick: NEGATIVE
Ketones, ur: NEGATIVE
Leukocytes,Ua: NEGATIVE
Nitrite: NEGATIVE
RBC / HPF: NONE SEEN (ref 0–?)
Specific Gravity, Urine: <=1.005 — AB (ref 1.000–1.030)
Total Protein, Urine: NEGATIVE
Urine Glucose: NEGATIVE
Urobilinogen, UA: 0.2 (ref 0.0–1.0)
WBC, UA: NONE SEEN (ref 0–?)
pH: 6 (ref 5.0–8.0)

## 2020-02-08 LAB — PSA: PSA: 1.47 ng/mL (ref 0.10–4.00)

## 2020-02-08 LAB — HEMOGLOBIN A1C: Hgb A1c MFr Bld: 5.3 % (ref 4.6–6.5)

## 2020-02-08 LAB — TSH: TSH: 2.67 u[IU]/mL (ref 0.35–4.50)

## 2020-02-08 NOTE — Progress Notes (Signed)
Subjective:    Patient ID: Glenn Bowman, male    DOB: 07-Mar-1960, 59 y.o.   MRN: [id number]  HPI  Here for wellness and f/u;  Overall doing ok;  Pt denies Chest pain, worsening SOB, DOE, wheezing, orthopnea, PND, worsening LE edema, palpitations, dizziness or syncope.  Pt denies neurological change such as new headache, facial or extremity weakness.  Pt denies polydipsia, polyuria, or low sugar symptoms. Pt states overall good compliance with treatment and medications, good tolerability, and has been trying to follow appropriate diet.  Pt denies worsening depressive symptoms, suicidal ideation or panic. No fever, night sweats, wt loss, loss of appetite, or other constitutional symptoms.  Pt states good ability with ADL's, has low fall risk, home safety reviewed and adequate, no other significant changes in hearing or vision, and only occasionally active with exercise. BP at dental yesterday was < 140/90. Wt Readings from Last 3 Encounters:  02/08/20 212 lb 3.2 oz (96.3 kg)  12/27/18 214 lb 3.2 oz (97.2 kg)  09/03/17 214 lb (97.1 kg)   Past Medical History:  Diagnosis Date   Allergic rhinitis    Allergy    Hyperlipidemia    Squamous cell skin cancer    Past Surgical History:  Procedure Laterality Date   MOHS SURGERY     TOOTH EXTRACTION      reports that he has never smoked. He has never used smokeless tobacco. He reports current alcohol use. He reports that he does not use drugs. family history includes Cancer in his father; Colon cancer in his maternal grandmother; Colon polyps in his brother; Heart disease (age of onset: 4) in his father; Heart disease (age of onset: 18) in his mother; Hypertension in his brother, father, and mother. Allergies  Allergen Reactions   Thimerosal Other (See Comments)    Ocular irritation   Current Outpatient Medications on File Prior to Visit  Medication Sig Dispense Refill   aspirin EC 81 MG tablet Take 1 tablet (81 mg total) by mouth daily.  90 tablet 11   doxycycline (PERIOSTAT) 20 MG tablet Take 20 mg by mouth 2 (two) times daily.     rosuvastatin (CRESTOR) 10 MG tablet TAKE ONE TABLET BY MOUTH DAILY 30 tablet 0   No current facility-administered medications on file prior to visit.   Review of Systems All otherwise neg per pt    Objective:   Physical Exam BP 130/74    Pulse 73    Temp 97.9 F (36.6 C) (Oral)    Ht 5\' 10"  (1.778 m)    Wt 212 lb 3.2 oz (96.3 kg)    SpO2 98%    BMI 30.45 kg/m  VS noted,  Constitutional: Pt appears in NAD HENT: Head: NCAT.  Right Ear: External ear normal.  Left Ear: External ear normal.  Eyes: . Pupils are equal, round, and reactive to light. Conjunctivae and EOM are normal Nose: without d/c or deformity Neck: Neck supple. Gross normal ROM Cardiovascular: Normal rate and regular rhythm.   Pulmonary/Chest: Effort normal and breath sounds without rales or wheezing.  Abd:  Soft, NT, ND, + BS, no organomegaly Neurological: Pt is alert. At baseline orientation, motor grossly intact Skin: Skin is warm. No rashes, other new lesions, no LE edema Psychiatric: Pt behavior is normal without agitation  All otherwise neg per pt Lab Results  Component Value Date   WBC 4.5 02/08/2020   HGB 16.2 02/08/2020   HCT 47.4 02/08/2020   PLT 197.0 02/08/2020  GLUCOSE 94 02/08/2020   CHOL 163 02/08/2020   TRIG 63.0 02/08/2020   HDL 60.10 02/08/2020   LDLCALC 90 02/08/2020   ALT 23 02/08/2020   AST 23 02/08/2020   NA 138 02/08/2020   K 4.2 02/08/2020   CL 104 02/08/2020   CREATININE 0.95 02/08/2020   BUN 15 02/08/2020   CO2 27 02/08/2020   TSH 2.67 02/08/2020   PSA 1.47 02/08/2020   HGBA1C 5.3 02/08/2020      Assessment & Plan:

## 2020-02-08 NOTE — Patient Instructions (Signed)
You had the flu shot today  We have discussed the Cardiac CT Score test to measure the calcification level (if any) in your heart arteries.  This test has been ordered in our [place], so please call [facility] directly, as they prefer this, at [phone] to be scheduled.  Please continue all other medications as before, and refills have been done if requested.  Please have the pharmacy call with any other refills you may need.  Please continue your efforts at being more active, low cholesterol diet, and weight control.  You are otherwise up to date with prevention measures today.  Please keep your appointments with your specialists as you may have planned  Please go to the LAB at the blood drawing area for the tests to be done  You will be contacted by phone if any changes need to be made immediately.  Otherwise, you will receive a letter about your results with an explanation, but please check with MyChart first.  Please remember to sign up for MyChart if you have not done so, as this will be important to you in the future with finding out test results, communicating by private email, and scheduling acute appointments online when needed.  Please make an Appointment to return for your 1 year visit, or sooner if needed, with Lab testing by Appointment as well, to be done about 3-5 days before at the [facility] (so this is for TWO appointments - please see the scheduling desk as you leave)  Due to the ongoing Covid 19 pandemic, our lab now requires an appointment for any labs done at our office.  If you need labs done and do not have an appointment, please call our office ahead of time to schedule before presenting to the lab for your testing.

## 2020-02-09 ENCOUNTER — Encounter: Payer: Self-pay | Admitting: Internal Medicine

## 2020-02-11 ENCOUNTER — Encounter: Payer: Self-pay | Admitting: Internal Medicine

## 2020-02-11 NOTE — Assessment & Plan Note (Addendum)
stable overall by history and exam, recent data reviewed with pt, and pt to continue medical treatment as before,  to f/u any worsening symptoms or concerns, also for cardiac ct score  Lab Results  Component Value Date   LDLCALC 90 02/08/2020

## 2020-02-11 NOTE — Assessment & Plan Note (Signed)
stable overall by history and exam, recent data reviewed with pt, and pt to continue medical treatment as before,  to f/u any worsening symptoms or concerns  

## 2020-02-11 NOTE — Assessment & Plan Note (Signed)

## 2020-02-15 ENCOUNTER — Telehealth: Payer: Self-pay | Admitting: Internal Medicine

## 2020-02-15 NOTE — Telephone Encounter (Signed)
1.Medication Requested:rosuvastatin (CRESTOR) 10 MG tablet   2. Pharmacy (Name, Street, City):[facility] [place] [place]  3. On Med List: yes   4. Last Visit with PCP: 12.16.2021  5. Next visit date with PCP: 12.19.2021   Agent: Please be advised that RX refills may take up to 3 business days. We ask that you follow-up with your pharmacy.

## 2020-02-19 ENCOUNTER — Other Ambulatory Visit: Payer: Self-pay

## 2020-02-19 DIAGNOSIS — E785 Hyperlipidemia, unspecified: Secondary | ICD-10-CM

## 2020-02-19 MED ORDER — ROSUVASTATIN CALCIUM 10 MG PO TABS: 10.0000 mg | ORAL_TABLET | Freq: Every day | ORAL | 3 refills | Status: DC

## 2020-03-01 ENCOUNTER — Encounter: Payer: Self-pay | Admitting: Internal Medicine

## 2020-05-22 ENCOUNTER — Other Ambulatory Visit: Payer: Self-pay | Admitting: Internal Medicine

## 2020-05-22 DIAGNOSIS — E785 Hyperlipidemia, unspecified: Secondary | ICD-10-CM

## 2020-05-22 NOTE — Telephone Encounter (Signed)
Please refill as per office routine med refill policy (all routine meds refilled for 3 mo or monthly per pt preference up to one year from last visit, then month to month grace period for 3 mo, then further med refills will have to be denied)  

## 2020-09-24 ENCOUNTER — Telehealth: Payer: Self-pay | Admitting: Internal Medicine

## 2020-09-25 ENCOUNTER — Other Ambulatory Visit: Payer: Self-pay

## 2020-09-25 DIAGNOSIS — E785 Hyperlipidemia, unspecified: Secondary | ICD-10-CM

## 2020-09-25 MED ORDER — ROSUVASTATIN CALCIUM 10 MG PO TABS: 10.0000 mg | ORAL_TABLET | Freq: Every day | ORAL | 2 refills | Status: DC

## 2020-09-27 ENCOUNTER — Other Ambulatory Visit: Payer: Self-pay

## 2020-10-21 MED ORDER — ROSUVASTATIN CALCIUM 10 MG PO TABS: 10.0000 mg | ORAL_TABLET | Freq: Every day | ORAL | 1 refills | Status: DC

## 2020-10-21 NOTE — Addendum Note (Signed)
Addended by: Elza Rafter D on: 10/21/2020 11:07 AM   Modules accepted: Orders

## 2021-02-10 ENCOUNTER — Encounter: Payer: Self-pay | Admitting: Internal Medicine

## 2021-02-10 ENCOUNTER — Other Ambulatory Visit: Payer: Self-pay

## 2021-02-10 ENCOUNTER — Ambulatory Visit (INDEPENDENT_AMBULATORY_CARE_PROVIDER_SITE_OTHER): Payer: 59 | Admitting: Internal Medicine

## 2021-02-10 VITALS — BP 128/80 | HR 68 | Temp 98.3°F | Ht 71.0 in | Wt 218.0 lb

## 2021-02-10 DIAGNOSIS — E7800 Pure hypercholesterolemia, unspecified: Secondary | ICD-10-CM | POA: Diagnosis not present

## 2021-02-10 DIAGNOSIS — E538 Deficiency of other specified B group vitamins: Secondary | ICD-10-CM | POA: Diagnosis not present

## 2021-02-10 DIAGNOSIS — R739 Hyperglycemia, unspecified: Secondary | ICD-10-CM

## 2021-02-10 DIAGNOSIS — E559 Vitamin D deficiency, unspecified: Secondary | ICD-10-CM | POA: Diagnosis not present

## 2021-02-10 DIAGNOSIS — N529 Male erectile dysfunction, unspecified: Secondary | ICD-10-CM | POA: Diagnosis not present

## 2021-02-10 DIAGNOSIS — Z0001 Encounter for general adult medical examination with abnormal findings: Secondary | ICD-10-CM

## 2021-02-10 LAB — HEPATIC FUNCTION PANEL
ALT: 26 U/L (ref 0–53)
AST: 22 U/L (ref 0–37)
Albumin: 4.4 g/dL (ref 3.5–5.2)
Alkaline Phosphatase: 78 U/L (ref 39–117)
Bilirubin, Direct: 0.1 mg/dL (ref 0.0–0.3)
Total Bilirubin: 0.7 mg/dL (ref 0.2–1.2)
Total Protein: 6.7 g/dL (ref 6.0–8.3)

## 2021-02-10 LAB — LIPID PANEL
Cholesterol: 147 mg/dL (ref 0–200)
HDL: 63.9 mg/dL (ref >39.00)
LDL Cholesterol: 71 mg/dL (ref 0–99)
NonHDL: 83.17
Total CHOL/HDL Ratio: 2
Triglycerides: 63 mg/dL (ref 0.0–149.0)
VLDL: 12.6 mg/dL (ref 0.0–40.0)

## 2021-02-10 LAB — CBC WITH DIFFERENTIAL/PLATELET
Basophils Absolute: 0 10*3/uL (ref 0.0–0.1)
Basophils Relative: 0.3 % (ref 0.0–3.0)
Eosinophils Absolute: 0.1 10*3/uL (ref 0.0–0.7)
Eosinophils Relative: 2.9 % (ref 0.0–5.0)
HCT: 47 % (ref 39.0–52.0)
Hemoglobin: 15.8 g/dL (ref 13.0–17.0)
Lymphocytes Relative: 34.4 % (ref 12.0–46.0)
Lymphs Abs: 1.4 10*3/uL (ref 0.7–4.0)
MCHC: 33.6 g/dL (ref 30.0–36.0)
MCV: 92.6 fl (ref 78.0–100.0)
Monocytes Absolute: 0.4 10*3/uL (ref 0.1–1.0)
Monocytes Relative: 9.5 % (ref 3.0–12.0)
Neutro Abs: 2.1 10*3/uL (ref 1.4–7.7)
Neutrophils Relative %: 52.9 % (ref 43.0–77.0)
Platelets: 187 10*3/uL (ref 150.0–400.0)
RBC: 5.08 Mil/uL (ref 4.22–5.81)
RDW: 13 % (ref 11.5–15.5)
WBC: 3.9 10*3/uL — ABNORMAL LOW (ref 4.0–10.5)

## 2021-02-10 LAB — BASIC METABOLIC PANEL
BUN: 11 mg/dL (ref 6–23)
CO2: 30 mEq/L (ref 19–32)
Calcium: 9.5 mg/dL (ref 8.4–10.5)
Chloride: 104 mEq/L (ref 96–112)
Creatinine, Ser: 0.99 mg/dL (ref 0.40–1.50)
GFR: 84.11 mL/min (ref >60.00)
Glucose, Bld: 105 mg/dL — ABNORMAL HIGH (ref 70–99)
Potassium: 4.9 mEq/L (ref 3.5–5.1)
Sodium: 141 mEq/L (ref 135–145)

## 2021-02-10 LAB — URINALYSIS, ROUTINE W REFLEX MICROSCOPIC
Bilirubin Urine: NEGATIVE
Hgb urine dipstick: NEGATIVE
Ketones, ur: NEGATIVE
Leukocytes,Ua: NEGATIVE
Nitrite: NEGATIVE
RBC / HPF: NONE SEEN (ref 0–?)
Specific Gravity, Urine: 1.01 (ref 1.000–1.030)
Total Protein, Urine: NEGATIVE
Urine Glucose: NEGATIVE
Urobilinogen, UA: 0.2 (ref 0.0–1.0)
WBC, UA: NONE SEEN (ref 0–?)
pH: 6 (ref 5.0–8.0)

## 2021-02-10 LAB — HEMOGLOBIN A1C: Hgb A1c MFr Bld: 5.5 % (ref 4.6–6.5)

## 2021-02-10 LAB — TSH: TSH: 2.84 u[IU]/mL (ref 0.35–5.50)

## 2021-02-10 LAB — VITAMIN B12: Vitamin B-12: 222 pg/mL (ref 211–911)

## 2021-02-10 LAB — VITAMIN D 25 HYDROXY (VIT D DEFICIENCY, FRACTURES): VITD: 28.71 ng/mL — ABNORMAL LOW (ref 30.00–100.00)

## 2021-02-10 LAB — PSA: PSA: 1 ng/mL (ref 0.10–4.00)

## 2021-02-10 MED ORDER — SILDENAFIL CITRATE 100 MG PO TABS: 50.0000 mg | ORAL_TABLET | Freq: Every day | ORAL | 11 refills | Status: DC | PRN

## 2021-02-10 NOTE — Progress Notes (Signed)
Patient ID: Glenn Bowman, male   DOB: [birth year], 60 y.o.   MRN: [id number]         Chief Complaint:: wellness exam and worsening ED symptoms       HPI:  Glenn Bowman is a 60 y.o. male here for wellness exam; declines shignrix, covid booster and pneumovax, o/w up to date                        Also overall otherwise doing well.  Pt denies chest pain, increased sob or doe, wheezing, orthopnea, PND, increased LE swelling, palpitations, dizziness or syncope.   Pt denies polydipsia, polyuria, or new focal neuro s/s.   Pt denies fever, wt loss, night sweats, loss of appetite, or other constitutional symptoms  Has mild worsening ED symptoms over the past several months, asks for viagra trial.  No other new complaints '   Wt Readings from Last 3 Encounters:  02/10/21 218 lb (98.9 kg)  02/08/20 212 lb 3.2 oz (96.3 kg)  12/27/18 214 lb 3.2 oz (97.2 kg)   BP Readings from Last 3 Encounters:  02/10/21 128/80  02/08/20 130/74  12/27/18 128/76   Immunization History  Administered Date(s) Administered   Influenza,inj,Quad PF,6+ Mos 12/27/2018, 02/08/2020   Influenza-Unspecified 01/20/2016, 11/26/2016, 12/04/2017, 12/26/2017, 01/02/2021   PFIZER(Purple Top)SARS-COV-2 Vaccination 04/29/2019, 05/24/2019, 11/23/2019, 12/07/2020   Tdap 07/04/2013   There are no preventive care reminders to display for this patient.     Past Medical History:  Diagnosis Date   Allergic rhinitis    Allergy    Hyperlipidemia    Squamous cell skin cancer    Past Surgical History:  Procedure Laterality Date   MOHS SURGERY     TOOTH EXTRACTION      reports that he has never smoked. He has never used smokeless tobacco. He reports current alcohol use. He reports that he does not use drugs. family history includes Cancer in his father; Colon cancer in his maternal grandmother; Colon polyps in his brother; Heart disease (age of onset: 76) in his father; Heart disease (age of onset: 78) in his mother; Hypertension in his  brother, father, and mother. Allergies  Allergen Reactions   Thimerosal Other (See Comments)    Ocular irritation   Current Outpatient Medications on File Prior to Visit  Medication Sig Dispense Refill   aspirin EC 81 MG tablet Take 1 tablet (81 mg total) by mouth daily. 90 tablet 11   doxycycline (PERIOSTAT) 20 MG tablet Take 20 mg by mouth 2 (two) times daily.     rosuvastatin (CRESTOR) 10 MG tablet Take 1 tablet (10 mg total) by mouth daily. 90 tablet 1   No current facility-administered medications on file prior to visit.        ROS:  All others reviewed and negative.  Objective        PE:  BP 128/80 (BP Location: Right Arm, Patient Position: Sitting, Cuff Size: Large)    Pulse 68    Temp 98.3 F (36.8 C) (Oral)    Ht 5\' 11"  (1.803 m)    Wt 218 lb (98.9 kg)    SpO2 98%    BMI 30.40 kg/m                 Constitutional: Pt appears in NAD               HENT: Head: NCAT.  Right Ear: External ear normal.                 Left Ear: External ear normal.                Eyes: . Pupils are equal, round, and reactive to light. Conjunctivae and EOM are normal               Nose: without d/c or deformity               Neck: Neck supple. Gross normal ROM               Cardiovascular: Normal rate and regular rhythm.                 Pulmonary/Chest: Effort normal and breath sounds without rales or wheezing.                Abd:  Soft, NT, ND, + BS, no organomegaly               Neurological: Pt is alert. At baseline orientation, motor grossly intact               Skin: Skin is warm. No rashes, no other new lesions, LE edema - none               Psychiatric: Pt behavior is normal without agitation   Micro: none  Cardiac tracings I have personally interpreted today:  none  Pertinent Radiological findings (summarize): none   Lab Results  Component Value Date   WBC 3.9 (L) 02/10/2021   HGB 15.8 02/10/2021   HCT 47.0 02/10/2021   PLT 187.0 02/10/2021   GLUCOSE 105 (H)  02/10/2021   CHOL 147 02/10/2021   TRIG 63.0 02/10/2021   HDL 63.90 02/10/2021   LDLCALC 71 02/10/2021   ALT 26 02/10/2021   AST 22 02/10/2021   NA 141 02/10/2021   K 4.9 02/10/2021   CL 104 02/10/2021   CREATININE 0.99 02/10/2021   BUN 11 02/10/2021   CO2 30 02/10/2021   TSH 2.84 02/10/2021   PSA 1.00 02/10/2021   HGBA1C 5.5 02/10/2021   Assessment/Plan:  Glenn Bowman is a 60 y.o. Other or two or more races [6] male with  has a past medical history of Allergic rhinitis, Allergy, Hyperlipidemia, and Squamous cell skin cancer.  Hyperlipidemia Lab Results  Component Value Date   LDLCALC 71 02/10/2021   Stable, goal ldl < 100 , pt to contnue crestor   Hyperglycemia Lab Results  Component Value Date   HGBA1C 5.5 02/10/2021   Stable, pt to continue current medical treatment  - diet   Erectile dysfunction Mild new worsening, for viagra prn,  to f/u any worsening symptoms or concerns  Vitamin D deficiency Last vitamin D Lab Results  Component Value Date   VD25OH 28.71 (L) 02/10/2021   Low, to start oral replacement  Followup: Return in about 1 year (around 02/10/2022).  Cathlean Cower, MD 02/14/2021 8:29 AM [facility]

## 2021-02-10 NOTE — Patient Instructions (Addendum)
Please consider the Shingrix vax after checking the insurance  Please take all new medication as prescribed - the viagra sent to the local [facility]  Please continue all other medications as before, and refills have been done if requested.  Please have the pharmacy call with any other refills you may need.  Please continue your efforts at being more active, low cholesterol diet, and weight control.  You are otherwise up to date with prevention measures today.  Please keep your appointments with your specialists as you may have planned  Please go to the LAB at the blood drawing area for the tests to be done  You will be contacted by phone if any changes need to be made immediately.  Otherwise, you will receive a letter about your results with an explanation, but please check with MyChart first.   Please remember to sign up for MyChart if you have not done so, as this will be important to you in the future with finding out test results, communicating by private email, and scheduling acute appointments online when needed.  We have discussed the Cardiac CT Score test to measure the calcification level (if any) in your heart arteries.  This test has been ordered in our [facility], so if you like, please call [facility] directly, as they prefer this, at [phone] to be scheduled.  Please make an Appointment to return for your 1 year visit, or sooner if needed

## 2021-02-14 ENCOUNTER — Encounter: Payer: Self-pay | Admitting: Internal Medicine

## 2021-02-14 NOTE — Assessment & Plan Note (Signed)
Lab Results  Component Value Date   LDLCALC 71 02/10/2021   Stable, goal ldl < 100 , pt to contnue crestor

## 2021-02-14 NOTE — Addendum Note (Signed)
Addended by: Biagio Borg on: 02/14/2021 08:31 AM   Modules accepted: Orders

## 2021-02-14 NOTE — Assessment & Plan Note (Signed)
Lab Results  Component Value Date   HGBA1C 5.5 02/10/2021   Stable, pt to continue current medical treatment  - diet

## 2021-02-14 NOTE — Assessment & Plan Note (Signed)
Last vitamin D Lab Results  Component Value Date   VD25OH 28.71 (L) 02/10/2021   Low, to start oral replacement

## 2021-02-14 NOTE — Assessment & Plan Note (Signed)
Mild new worsening, for viagra prn,  to f/u any worsening symptoms or concerns

## 2021-06-04 ENCOUNTER — Other Ambulatory Visit: Payer: Self-pay | Admitting: Internal Medicine

## 2021-06-04 DIAGNOSIS — E785 Hyperlipidemia, unspecified: Secondary | ICD-10-CM

## 2021-06-04 NOTE — Telephone Encounter (Signed)
Please refill as per office routine med refill policy (all routine meds to be refilled for 3 mo or monthly (per pt preference) up to one year from last visit, then month to month grace period for 3 mo, then further med refills will have to be denied) ? ?

## 2022-02-10 ENCOUNTER — Encounter: Payer: 59 | Admitting: Internal Medicine

## 2022-03-02 ENCOUNTER — Other Ambulatory Visit: Payer: Self-pay | Admitting: Internal Medicine

## 2022-03-02 DIAGNOSIS — E785 Hyperlipidemia, unspecified: Secondary | ICD-10-CM

## 2022-03-02 NOTE — Telephone Encounter (Signed)
Please refill as per office routine med refill policy (all routine meds to be refilled for 3 mo or monthly (per pt preference) up to one year from last visit, then month to month grace period for 3 mo, then further med refills will have to be denied) ? ?

## 2022-03-24 ENCOUNTER — Encounter: Payer: Self-pay | Admitting: Internal Medicine

## 2022-03-24 ENCOUNTER — Ambulatory Visit (INDEPENDENT_AMBULATORY_CARE_PROVIDER_SITE_OTHER): Payer: 59 | Admitting: Internal Medicine

## 2022-03-24 VITALS — BP 124/76 | HR 67 | Temp 98.0°F | Ht 71.0 in | Wt 213.0 lb

## 2022-03-24 DIAGNOSIS — R739 Hyperglycemia, unspecified: Secondary | ICD-10-CM

## 2022-03-24 DIAGNOSIS — E538 Deficiency of other specified B group vitamins: Secondary | ICD-10-CM | POA: Diagnosis not present

## 2022-03-24 DIAGNOSIS — R030 Elevated blood-pressure reading, without diagnosis of hypertension: Secondary | ICD-10-CM

## 2022-03-24 DIAGNOSIS — E785 Hyperlipidemia, unspecified: Secondary | ICD-10-CM | POA: Diagnosis not present

## 2022-03-24 DIAGNOSIS — Z0001 Encounter for general adult medical examination with abnormal findings: Secondary | ICD-10-CM | POA: Diagnosis not present

## 2022-03-24 DIAGNOSIS — E559 Vitamin D deficiency, unspecified: Secondary | ICD-10-CM | POA: Diagnosis not present

## 2022-03-24 LAB — CBC WITH DIFFERENTIAL/PLATELET
Basophils Absolute: 0 10*3/uL (ref 0.0–0.1)
Basophils Relative: 0.3 % (ref 0.0–3.0)
Eosinophils Absolute: 0.2 10*3/uL (ref 0.0–0.7)
Eosinophils Relative: 2.9 % (ref 0.0–5.0)
HCT: 46.2 % (ref 39.0–52.0)
Hemoglobin: 15.9 g/dL (ref 13.0–17.0)
Lymphocytes Relative: 31.1 % (ref 12.0–46.0)
Lymphs Abs: 1.6 10*3/uL (ref 0.7–4.0)
MCHC: 34.4 g/dL (ref 30.0–36.0)
MCV: 91.5 fl (ref 78.0–100.0)
Monocytes Absolute: 0.5 10*3/uL (ref 0.1–1.0)
Monocytes Relative: 8.8 % (ref 3.0–12.0)
Neutro Abs: 2.9 10*3/uL (ref 1.4–7.7)
Neutrophils Relative %: 56.9 % (ref 43.0–77.0)
Platelets: 194 10*3/uL (ref 150.0–400.0)
RBC: 5.05 Mil/uL (ref 4.22–5.81)
RDW: 12.8 % (ref 11.5–15.5)
WBC: 5.1 10*3/uL (ref 4.0–10.5)

## 2022-03-24 LAB — URINALYSIS, ROUTINE W REFLEX MICROSCOPIC
Bilirubin Urine: NEGATIVE
Hgb urine dipstick: NEGATIVE
Ketones, ur: NEGATIVE
Leukocytes,Ua: NEGATIVE
Nitrite: NEGATIVE
RBC / HPF: NONE SEEN (ref 0–?)
Specific Gravity, Urine: 1.02 (ref 1.000–1.030)
Total Protein, Urine: NEGATIVE
Urine Glucose: NEGATIVE
Urobilinogen, UA: 0.2 (ref 0.0–1.0)
pH: 6 (ref 5.0–8.0)

## 2022-03-24 LAB — PSA: PSA: 1.13 ng/mL (ref 0.10–4.00)

## 2022-03-24 LAB — HEPATIC FUNCTION PANEL
ALT: 19 U/L (ref 0–53)
AST: 19 U/L (ref 0–37)
Albumin: 4.6 g/dL (ref 3.5–5.2)
Alkaline Phosphatase: 80 U/L (ref 39–117)
Bilirubin, Direct: 0.1 mg/dL (ref 0.0–0.3)
Total Bilirubin: 0.7 mg/dL (ref 0.2–1.2)
Total Protein: 6.9 g/dL (ref 6.0–8.3)

## 2022-03-24 LAB — HEMOGLOBIN A1C: Hgb A1c MFr Bld: 5.4 % (ref 4.6–6.5)

## 2022-03-24 LAB — LIPID PANEL
Cholesterol: 148 mg/dL (ref 0–200)
HDL: 56.4 mg/dL (ref >39.00)
LDL Cholesterol: 72 mg/dL (ref 0–99)
NonHDL: 91.81
Total CHOL/HDL Ratio: 3
Triglycerides: 98 mg/dL (ref 0.0–149.0)
VLDL: 19.6 mg/dL (ref 0.0–40.0)

## 2022-03-24 LAB — VITAMIN B12: Vitamin B-12: 251 pg/mL (ref 211–911)

## 2022-03-24 LAB — BASIC METABOLIC PANEL
BUN: 12 mg/dL (ref 6–23)
CO2: 28 mEq/L (ref 19–32)
Calcium: 9.5 mg/dL (ref 8.4–10.5)
Chloride: 105 mEq/L (ref 96–112)
Creatinine, Ser: 0.99 mg/dL (ref 0.40–1.50)
GFR: 83.45 mL/min (ref >60.00)
Glucose, Bld: 96 mg/dL (ref 70–99)
Potassium: 4.9 mEq/L (ref 3.5–5.1)
Sodium: 140 mEq/L (ref 135–145)

## 2022-03-24 LAB — VITAMIN D 25 HYDROXY (VIT D DEFICIENCY, FRACTURES): VITD: 25.28 ng/mL — ABNORMAL LOW (ref 30.00–100.00)

## 2022-03-24 LAB — TSH: TSH: 2.71 u[IU]/mL (ref 0.35–5.50)

## 2022-03-24 MED ORDER — ROSUVASTATIN CALCIUM 10 MG PO TABS: 10.0000 mg | ORAL_TABLET | Freq: Every day | ORAL | 3 refills | Status: DC

## 2022-03-24 NOTE — Assessment & Plan Note (Signed)
BP Readings from Last 3 Encounters:  03/24/22 124/76  02/10/21 128/80  02/08/20 130/74   Stable, pt to continue medical treatment  - diet, wt control

## 2022-03-24 NOTE — Assessment & Plan Note (Signed)
Last vitamin D Lab Results  Component Value Date   VD25OH 28.71 (L) 02/10/2021   Low, to start replacement

## 2022-03-24 NOTE — Assessment & Plan Note (Signed)
Lab Results  Component Value Date   VITAMINB12 251 03/24/2022   Low, to start oral replacement - b12 1000 mcg qd

## 2022-03-24 NOTE — Progress Notes (Signed)
Patient ID: Glenn Bowman, male   DOB: [birth year], 62 y.o.   MRN: [id number]         Chief Complaint:: wellness exam and low vit d and b12, hyperglycemia, hld, elevated BP without htn       HPI:  Glenn Bowman is a 62 y.o. male here for wellness exam; declines covid booster, for shingrix at pharmacy, o/w up to date                        Also Pt denies chest pain, increased sob or doe, wheezing, orthopnea, PND, increased LE swelling, palpitations, dizziness or syncope.   Pt denies polydipsia, polyuria, or new focal neuro s/s.    Pt denies fever, wt loss, night sweats, loss of appetite, or other constitutional symptoms BP has been < 140/90 at home   Wt Readings from Last 3 Encounters:  03/24/22 213 lb (96.6 kg)  02/10/21 218 lb (98.9 kg)  02/08/20 212 lb 3.2 oz (96.3 kg)   BP Readings from Last 3 Encounters:  03/24/22 124/76  02/10/21 128/80  02/08/20 130/74   Immunization History  Administered Date(s) Administered   COVID-19, mRNA, vaccine(Comirnaty)12 years and older 12/05/2021   Influenza,inj,Quad PF,6+ Mos 12/27/2018, 02/08/2020   Influenza-Unspecified 01/20/2016, 11/26/2016, 12/04/2017, 12/26/2017, 01/02/2021, 12/05/2021   PFIZER(Purple Top)SARS-COV-2 Vaccination 04/29/2019, 05/24/2019, 11/23/2019, 12/07/2020, 12/01/2021   Tdap 07/04/2013   Health Maintenance Due  Topic Date Due   COVID-19 Vaccine (6 - 2023-24 season) 01/30/2022      Past Medical History:  Diagnosis Date   Allergic rhinitis    Allergy    Hyperlipidemia    Squamous cell skin cancer    Past Surgical History:  Procedure Laterality Date   MOHS SURGERY     TOOTH EXTRACTION      reports that he has never smoked. He has never used smokeless tobacco. He reports current alcohol use. He reports that he does not use drugs. family history includes Cancer in his father; Colon cancer in his maternal grandmother; Colon polyps in his brother; Heart disease (age of onset: 77) in his father; Heart disease (age of onset:  73) in his mother; Hypertension in his brother, father, and mother. Allergies  Allergen Reactions   Thimerosal (Thiomersal) Other (See Comments)    Ocular irritation   Current Outpatient Medications on File Prior to Visit  Medication Sig Dispense Refill   aspirin EC 81 MG tablet Take 1 tablet (81 mg total) by mouth daily. 90 tablet 11   doxycycline (PERIOSTAT) 20 MG tablet Take 20 mg by mouth 2 (two) times daily.     sildenafil (VIAGRA) 100 MG tablet Take 0.5-1 tablets (50-100 mg total) by mouth daily as needed for erectile dysfunction. 5 tablet 11   simvastatin (ZOCOR) 20 MG tablet Simvastatin     No current facility-administered medications on file prior to visit.        ROS:  All others reviewed and negative.  Objective        PE:  BP 124/76 (BP Location: Left Arm, Patient Position: Sitting, Cuff Size: Large)   Pulse 67   Temp 98 F (36.7 C) (Oral)   Ht '5\' 11"'$  (1.803 m)   Wt 213 lb (96.6 kg)   SpO2 95%   BMI 29.71 kg/m                 Constitutional: Pt appears in NAD  HENT: Head: NCAT.                Right Ear: External ear normal.                 Left Ear: External ear normal.                Eyes: . Pupils are equal, round, and reactive to light. Conjunctivae and EOM are normal               Nose: without d/c or deformity               Neck: Neck supple. Gross normal ROM               Cardiovascular: Normal rate and regular rhythm.                 Pulmonary/Chest: Effort normal and breath sounds without rales or wheezing.                Abd:  Soft, NT, ND, + BS, no organomegaly               Neurological: Pt is alert. At baseline orientation, motor grossly intact               Skin: Skin is warm. No rashes, no other new lesions, LE edema - none               Psychiatric: Pt behavior is normal without agitation   Micro: none  Cardiac tracings I have personally interpreted today:  none  Pertinent Radiological findings (summarize): none   Lab Results   Component Value Date   WBC 5.1 03/24/2022   HGB 15.9 03/24/2022   HCT 46.2 03/24/2022   PLT 194.0 03/24/2022   GLUCOSE 96 03/24/2022   CHOL 148 03/24/2022   TRIG 98.0 03/24/2022   HDL 56.40 03/24/2022   LDLCALC 72 03/24/2022   ALT 19 03/24/2022   AST 19 03/24/2022   NA 140 03/24/2022   K 4.9 03/24/2022   CL 105 03/24/2022   CREATININE 0.99 03/24/2022   BUN 12 03/24/2022   CO2 28 03/24/2022   TSH 2.71 03/24/2022   PSA 1.13 03/24/2022   HGBA1C 5.4 03/24/2022   Assessment/Plan:  Glenn Bowman is a 62 y.o. Other or two or more races [6] male with  has a past medical history of Allergic rhinitis, Allergy, Hyperlipidemia, and Squamous cell skin cancer.  Vitamin D deficiency Last vitamin D Lab Results  Component Value Date   VD25OH 28.71 (L) 02/10/2021   Low, to start replacement  Encounter for well adult exam with abnormal findings Age and sex appropriate education and counseling updated with regular exercise and diet Referrals for preventative services - none needed Immunizations addressed - declines covid booster, for shingrix at pharmacy Smoking counseling  - none needed Evidence for depression or other mood disorder - none significant Most recent labs reviewed. I have personally reviewed and have noted: 1) the patient's medical and social history 2) The patient's current medications and supplements 3) The patient's height, weight, and BMI have been recorded in the chart   Elevated blood pressure reading without diagnosis of hypertension BP Readings from Last 3 Encounters:  03/24/22 124/76  02/10/21 128/80  02/08/20 130/74   Stable, pt to continue medical treatment  - diet, wt control   Hyperglycemia Lab Results  Component Value Date   HGBA1C 5.4 03/24/2022   Stable, pt to continue current medical treatment  -  diet, wt control  Hyperlipidemia Lab Results  Component Value Date   LDLCALC 72 03/24/2022   Uncontrolled, goal ldl < 70,, pt to continue current  statin crestor 10 mg qd and lower chol diet as declines change   B12 deficiency Lab Results  Component Value Date   VITAMINB12 251 03/24/2022   Low, to start oral replacement - b12 1000 mcg qd  Followup: Return in about 1 year (around 03/25/2023).  Cathlean Cower, MD 03/24/2022 8:08 PM [facility]

## 2022-03-24 NOTE — Patient Instructions (Signed)
Please have your Shingrix (shingles) shots done at your local pharmacy.  Please continue all other medications as before, and refills have been done if requested.  Please have the pharmacy call with any other refills you may need.  Please continue your efforts at being more active, low cholesterol diet, and weight control.  You are otherwise up to date with prevention measures today.  Please keep your appointments with your specialists as you may have planned  Please go to the LAB at the blood drawing area for the tests to be done  You will be contacted by phone if any changes need to be made immediately.  Otherwise, you will receive a letter about your results with an explanation, but please check with MyChart first.  Please remember to sign up for MyChart if you have not done so, as this will be important to you in the future with finding out test results, communicating by private email, and scheduling acute appointments online when needed.  Please make an Appointment to return for your 1 year visit, or sooner if needed, with Lab testing by Appointment as well, to be done about 3-5 days before at the FIRST FLOOR Lab (so this is for TWO appointments - please see the scheduling desk as you leave)  

## 2022-03-24 NOTE — Assessment & Plan Note (Signed)
Lab Results  Component Value Date   HGBA1C 5.4 03/24/2022   Stable, pt to continue current medical treatment  - diet, wt control

## 2022-03-24 NOTE — Assessment & Plan Note (Signed)
Age and sex appropriate education and counseling updated with regular exercise and diet Referrals for preventative services - none needed Immunizations addressed - declines covid booster, for shingrix at pharmacy Smoking counseling  - none needed Evidence for depression or other mood disorder - none significant Most recent labs reviewed. I have personally reviewed and have noted: 1) the patient's medical and social history 2) The patient's current medications and supplements 3) The patient's height, weight, and BMI have been recorded in the chart  

## 2022-03-24 NOTE — Assessment & Plan Note (Signed)
Lab Results  Component Value Date   LDLCALC 72 03/24/2022   Uncontrolled, goal ldl < 70,, pt to continue current statin crestor 10 mg qd and lower chol diet as declines change

## 2022-10-19 ENCOUNTER — Other Ambulatory Visit: Payer: Self-pay

## 2022-10-19 ENCOUNTER — Emergency Department (HOSPITAL_COMMUNITY): Payer: 59

## 2022-10-19 ENCOUNTER — Encounter (HOSPITAL_COMMUNITY): Payer: Self-pay

## 2022-10-19 ENCOUNTER — Ambulatory Visit (HOSPITAL_COMMUNITY)
Admission: EM | Admit: 2022-10-19 | Discharge: 2022-10-20 | Disposition: A | Discharge status: Home / Self Care | Payer: 59 | Attending: Emergency Medicine | Admitting: Emergency Medicine

## 2022-10-19 DIAGNOSIS — R1084 Generalized abdominal pain: Secondary | ICD-10-CM

## 2022-10-19 DIAGNOSIS — K3580 Unspecified acute appendicitis: Secondary | ICD-10-CM | POA: Insufficient documentation

## 2022-10-19 DIAGNOSIS — Z7982 Long term (current) use of aspirin: Secondary | ICD-10-CM | POA: Diagnosis not present

## 2022-10-19 LAB — CBC
HCT: 47.6 % (ref 39.0–52.0)
Hemoglobin: 15.7 g/dL (ref 13.0–17.0)
MCH: 30.5 pg (ref 26.0–34.0)
MCHC: 33 g/dL (ref 30.0–36.0)
MCV: 92.6 fL (ref 80.0–100.0)
Platelets: 201 10*3/uL (ref 150–400)
RBC: 5.14 MIL/uL (ref 4.22–5.81)
RDW: 12.4 % (ref 11.5–15.5)
WBC: 9.8 10*3/uL (ref 4.0–10.5)
nRBC: 0 % (ref 0.0–0.2)

## 2022-10-19 LAB — LIPASE, BLOOD: Lipase: 33 U/L (ref 11–51)

## 2022-10-19 LAB — COMPREHENSIVE METABOLIC PANEL
ALT: 21 U/L (ref 0–44)
AST: 22 U/L (ref 15–41)
Albumin: 4.3 g/dL (ref 3.5–5.0)
Alkaline Phosphatase: 69 U/L (ref 38–126)
Anion gap: 11 (ref 5–15)
BUN: 11 mg/dL (ref 6–20)
CO2: 23 mmol/L (ref 22–32)
Calcium: 9 mg/dL (ref 8.9–10.3)
Chloride: 102 mmol/L (ref 98–111)
Creatinine, Ser: 0.97 mg/dL (ref 0.61–1.24)
GFR, Estimated: >60 mL/min (ref >60)
Glucose, Bld: 101 mg/dL — ABNORMAL HIGH (ref 70–99)
Potassium: 3.7 mmol/L (ref 3.5–5.1)
Sodium: 136 mmol/L (ref 135–145)
Total Bilirubin: 1 mg/dL (ref 0.3–1.2)
Total Protein: 7.1 g/dL (ref 6.5–8.1)

## 2022-10-19 LAB — URINALYSIS, ROUTINE W REFLEX MICROSCOPIC
Bilirubin Urine: NEGATIVE
Glucose, UA: NEGATIVE mg/dL
Hgb urine dipstick: NEGATIVE
Ketones, ur: 20 mg/dL — AB
Leukocytes,Ua: NEGATIVE
Nitrite: NEGATIVE
Protein, ur: NEGATIVE mg/dL
Specific Gravity, Urine: 1.025 (ref 1.005–1.030)
pH: 5 (ref 5.0–8.0)

## 2022-10-19 MED ORDER — SODIUM CHLORIDE 0.9 % IV SOLN
2.0000 g | Freq: Every day | INTRAVENOUS | Status: DC
  Administered 2022-10-20: 2 g via INTRAVENOUS
  Filled 2022-10-19: qty 20

## 2022-10-19 MED ORDER — IOHEXOL 350 MG/ML SOLN
75.0000 mL | Freq: Once | INTRAVENOUS | Status: AC | PRN
  Administered 2022-10-19: 75 mL via INTRAVENOUS

## 2022-10-19 MED ORDER — METRONIDAZOLE 500 MG/100ML IV SOLN
500.0000 mg | Freq: Three times a day (TID) | INTRAVENOUS | Status: DC
  Administered 2022-10-20: 500 mg via INTRAVENOUS
  Filled 2022-10-19: qty 100

## 2022-10-19 NOTE — ED Provider Notes (Signed)
[facility] Provider Note   CSN: [id number] Arrival date & time: 10/19/22  1745     History Chief Complaint  Patient presents with   Abdominal Pain    HPI Glenn Bowman is a 62 y.o. male presenting for chief complaint of right lower quadrant pain.  States that it started as periumbilical pain this morning at 8 AM.  It progressed throughout the day into more severe right lower quadrant pain.  Denies any fevers but endorses chills denies nausea vomiting or any other symptoms.  No history of similar no history of surgery otherwise healthy.  No blood thinners..   Patient's recorded medical, surgical, social, medication list and allergies were reviewed in the Snapshot window as part of the initial history.   Review of Systems   Review of Systems  Constitutional:  Negative for chills and fever.  HENT:  Negative for ear pain and sore throat.   Eyes:  Negative for pain and visual disturbance.  Respiratory:  Negative for cough and shortness of breath.   Cardiovascular:  Negative for chest pain and palpitations.  Gastrointestinal:  Positive for abdominal pain. Negative for vomiting.  Genitourinary:  Negative for dysuria and hematuria.  Musculoskeletal:  Negative for arthralgias and back pain.  Skin:  Negative for color change and rash.  Neurological:  Negative for seizures and syncope.  All other systems reviewed and are negative.   Physical Exam Updated Vital Signs BP 131/74   Pulse 64   Temp 98.5 F (36.9 C) (Oral)   Resp 17   Ht 5\' 10"  (1.778 m)   Wt 95.3 kg   SpO2 99%   BMI 30.13 kg/m  Physical Exam Vitals and nursing note reviewed.  Constitutional:      General: He is not in acute distress.    Appearance: He is well-developed.  HENT:     Head: Normocephalic and atraumatic.  Eyes:     Conjunctiva/sclera: Conjunctivae normal.  Cardiovascular:     Rate and Rhythm: Normal rate and regular rhythm.     Heart sounds: No murmur  heard. Pulmonary:     Effort: Pulmonary effort is normal. No respiratory distress.     Breath sounds: Normal breath sounds.  Abdominal:     Palpations: Abdomen is soft.     Tenderness: There is abdominal tenderness in the right lower quadrant. There is guarding. There is no right CVA tenderness or left CVA tenderness.  Musculoskeletal:        General: No swelling.     Cervical back: Neck supple.  Skin:    General: Skin is warm and dry.     Capillary Refill: Capillary refill takes less than 2 seconds.  Neurological:     Mental Status: He is alert.  Psychiatric:        Mood and Affect: Mood normal.      ED Course/ Medical Decision Making/ A&P    Procedures Procedures   Medications Ordered in ED Medications  iohexol (OMNIPAQUE) 350 MG/ML injection 75 mL (75 mLs Intravenous Contrast Given 10/19/22 2029)   Medical Decision Making:   Glenn Bowman is a 62 y.o. male who presented to the ED today with abdominal pain, detailed above.    Complete initial physical exam performed, notably the patient  was HDS in.     Reviewed and confirmed nursing documentation for past medical history, family history, social history.    Initial Assessment:   With the patient's presentation of abdominal pain, most  likely diagnosis is nonspecific etiology.  Developing appendicitis would be on the differential.. Other diagnoses were considered including (but not limited to) gastroenteritis, colitis, small bowel obstruction,  cholecystitis, pancreatitis, nephrolithiasis, UTI, pyleonephritis. These are considered less likely due to history of present illness and physical exam findings.   This is most consistent with an acute life/limb threatening illness complicated by underlying chronic conditions.   Initial Plan:  CBC/CMP to evaluate for underlying infectious/metabolic etiology for patient's abdominal pain  Lipase to evaluate for pancreatitis  CTAB/Pelvis with contrast to evaluate for structural/surgical  etiology of patients' severe abdominal pain.  Urinalysis and repeat physical assessment to evaluate for UTI/Pyelonpehritis  Empiric management of symptoms with escalating pain control and antiemetics as needed.   Initial Study Results:   Laboratory  All laboratory results reviewed without evidence of clinically relevant pathology.    Radiology All images reviewed independently. Agree with radiology report at this time.   CT ABDOMEN PELVIS W CONTRAST  Result Date: 10/19/2022 CLINICAL DATA:  Abdominal pain. EXAM: CT ABDOMEN AND PELVIS WITH CONTRAST TECHNIQUE: Multidetector CT imaging of the abdomen and pelvis was performed using the standard protocol following bolus administration of intravenous contrast. RADIATION DOSE REDUCTION: This exam was performed according to the departmental dose-optimization program which includes automated exposure control, adjustment of the mA and/or kV according to patient size and/or use of iterative reconstruction technique. CONTRAST:  75mL OMNIPAQUE IOHEXOL 350 MG/ML SOLN COMPARISON:  CT abdomen pelvis dated 01/08/2017. FINDINGS: Lower chest: Bibasilar linear atelectasis/scarring. No intra-abdominal free air.  Trace free fluid in the pelvis. Hepatobiliary: Multiple subcentimeter hepatic hypodense lesions, too small to characterize. No biliary dilatation. The gallbladder is unremarkable. Pancreas: Unremarkable. No pancreatic ductal dilatation or surrounding inflammatory changes. Spleen: Normal in size without focal abnormality. Adrenals/Urinary Tract: The adrenal glands unremarkable. There is no hydronephrosis on either side. There is symmetric enhancement and excretion of contrast by both kidneys. The visualized ureters and urinary bladder appear unremarkable. Stomach/Bowel: Several small scattered colonic diverticula without active inflammatory changes. There is no bowel obstruction. The appendix is mildly thickened measuring approximately 1 cm in diameter. There is  stranding and haziness of the periappendiceal fat. Findings concerning for an early acute appendicitis. Clinical correlation is recommended. No drainable fluid collection or abscess. The appendix is located in the right lower quadrant inferior to the cecum. Vascular/Lymphatic: The abdominal aorta and IVC are unremarkable. No portal venous gas. There is no adenopathy. There is mild haziness of the mesentery with multiple top-normal lymph nodes with a "misty mesentery" appearance. This finding is nonspecific but may be related to underlying inflammatory/infectious etiology. Reproductive: The prostate and seminal vesicles are grossly unremarkable. No pelvic mass. Other: None Musculoskeletal: Degenerative changes.  No acute osseous pathology. IMPRESSION: 1. Findings concerning for an early acute appendicitis. No drainable fluid collection or abscess. 2. Mild colonic diverticulosis. No bowel obstruction. Electronically Signed   By: Elgie Collard M.D.   On: 10/19/2022 21:15    Final Reassessment and Plan:   History of present illness physical exam findings remain most consistent with acute appendicitis.  Consulted general surgery for further care and management.      Clinical Impression:  1. Generalized abdominal pain      Data Unavailable   Final Clinical Impression(s) / ED Diagnoses Final diagnoses:  Generalized abdominal pain    Rx / DC Orders ED Discharge Orders     None         Glyn Ade, MD 10/19/22 2316

## 2022-10-19 NOTE — ED Triage Notes (Signed)
Pt reports mid abd pain that started this morning when he woke up. Now the pain is mainly in his RLQ. Pt denies n/v/d.

## 2022-10-19 NOTE — H&P (Signed)
Reason for Consult/Chief Complaint: acute appendicitis Consultant: Doran Durand, MD  Glenn Bowman is an 62 y.o. male.   HPI: [name] with abdominal pain that began 8/26 AM. Began as central abdominal pain that progressed to RLQ pain. No associated n/v, f/c, changes to bowels. Takes ASA.    Past Medical History:  Diagnosis Date   Allergic rhinitis    Allergy    Hyperlipidemia    Squamous cell skin cancer     Past Surgical History:  Procedure Laterality Date   MOHS SURGERY     TOOTH EXTRACTION      Family History  Problem Relation Age of Onset   Heart disease Mother 66   Hypertension Mother    Cancer Father        multiple myeloma   Heart disease Father 60   Hypertension Father    Hypertension Brother    Colon polyps Brother    Colon cancer Maternal Grandmother    Esophageal cancer Neg Hx    Rectal cancer Neg Hx    Stomach cancer Neg Hx     Social History:  reports that he has never smoked. He has never used smokeless tobacco. He reports current alcohol use. He reports that he does not use drugs.  Allergies:  Allergies  Allergen Reactions   Thimerosal (Thiomersal) Other (See Comments)    Ocular irritation    Medications: I have reviewed the patient's current medications.  Results for orders placed or performed during the hospital encounter of 10/19/22 (from the past 48 hour(s))  Lipase, blood     Status: None   Collection Time: 10/19/22  5:56 PM  Result Value Ref Range   Lipase 33 11 - 51 U/L    Comment: Performed at [facility], [address], [place], [place] [address]  Comprehensive metabolic panel     Status: Abnormal   Collection Time: 10/19/22  5:56 PM  Result Value Ref Range   Sodium 136 135 - 145 mmol/L   Potassium 3.7 3.5 - 5.1 mmol/L   Chloride 102 98 - 111 mmol/L   CO2 23 22 - 32 mmol/L   Glucose, Bld 101 (H) 70 - 99 mg/dL    Comment: Glucose reference range applies only to samples taken after fasting for at least 8 hours.   BUN 11 6 -  20 mg/dL   Creatinine, Ser 3.08 0.61 - 1.24 mg/dL   Calcium 9.0 8.9 - 65.7 mg/dL   Total Protein 7.1 6.5 - 8.1 g/dL   Albumin 4.3 3.5 - 5.0 g/dL   AST 22 15 - 41 U/L   ALT 21 0 - 44 U/L   Alkaline Phosphatase 69 38 - 126 U/L   Total Bilirubin 1.0 0.3 - 1.2 mg/dL   GFR, Estimated >84 >69 mL/min    Comment: (NOTE) Calculated using the CKD-EPI Creatinine Equation (2021)    Anion gap 11 5 - 15    Comment: Performed at [facility], [address] [place], [place] [address]  CBC     Status: None   Collection Time: 10/19/22  5:56 PM  Result Value Ref Range   WBC 9.8 4.0 - 10.5 K/uL   RBC 5.14 4.22 - 5.81 MIL/uL   Hemoglobin 15.7 13.0 - 17.0 g/dL   HCT 84.1 32.4 - 40.1 %   MCV 92.6 80.0 - 100.0 fL   MCH 30.5 26.0 - 34.0 pg   MCHC 33.0 30.0 - 36.0 g/dL   RDW 02.7 25.3 -  15.5 %   Platelets 201 150 - 400 K/uL   nRBC 0.0 0.0 - 0.2 %    Comment: Performed at [facility], [address] [place], [place] [address]  Urinalysis, Routine w reflex microscopic -Urine, Clean Catch     Status: Abnormal   Collection Time: 10/19/22  5:56 PM  Result Value Ref Range   Color, Urine YELLOW YELLOW   APPearance CLEAR CLEAR   Specific Gravity, Urine 1.025 1.005 - 1.030   pH 5.0 5.0 - 8.0   Glucose, UA NEGATIVE NEGATIVE mg/dL   Hgb urine dipstick NEGATIVE NEGATIVE   Bilirubin Urine NEGATIVE NEGATIVE   Ketones, ur 20 (A) NEGATIVE mg/dL   Protein, ur NEGATIVE NEGATIVE mg/dL   Nitrite NEGATIVE NEGATIVE   Leukocytes,Ua NEGATIVE NEGATIVE    Comment: Performed at [facility], [address] [place], [place] [address]    CT ABDOMEN PELVIS W CONTRAST  Result Date: 10/19/2022 CLINICAL DATA:  Abdominal pain. EXAM: CT ABDOMEN AND PELVIS WITH CONTRAST TECHNIQUE: Multidetector CT imaging of the abdomen and pelvis was performed using the standard protocol following bolus administration of intravenous contrast. RADIATION DOSE REDUCTION: This exam was performed according to the departmental  dose-optimization program which includes automated exposure control, adjustment of the mA and/or kV according to patient size and/or use of iterative reconstruction technique. CONTRAST:  75mL OMNIPAQUE IOHEXOL 350 MG/ML SOLN COMPARISON:  CT abdomen pelvis dated 01/08/2017. FINDINGS: Lower chest: Bibasilar linear atelectasis/scarring. No intra-abdominal free air.  Trace free fluid in the pelvis. Hepatobiliary: Multiple subcentimeter hepatic hypodense lesions, too small to characterize. No biliary dilatation. The gallbladder is unremarkable. Pancreas: Unremarkable. No pancreatic ductal dilatation or surrounding inflammatory changes. Spleen: Normal in size without focal abnormality. Adrenals/Urinary Tract: The adrenal glands unremarkable. There is no hydronephrosis on either side. There is symmetric enhancement and excretion of contrast by both kidneys. The visualized ureters and urinary bladder appear unremarkable. Stomach/Bowel: Several small scattered colonic diverticula without active inflammatory changes. There is no bowel obstruction. The appendix is mildly thickened measuring approximately 1 cm in diameter. There is stranding and haziness of the periappendiceal fat. Findings concerning for an early acute appendicitis. Clinical correlation is recommended. No drainable fluid collection or abscess. The appendix is located in the right lower quadrant inferior to the cecum. Vascular/Lymphatic: The abdominal aorta and IVC are unremarkable. No portal venous gas. There is no adenopathy. There is mild haziness of the mesentery with multiple top-normal lymph nodes with a "misty mesentery" appearance. This finding is nonspecific but may be related to underlying inflammatory/infectious etiology. Reproductive: The prostate and seminal vesicles are grossly unremarkable. No pelvic mass. Other: None Musculoskeletal: Degenerative changes.  No acute osseous pathology. IMPRESSION: 1. Findings concerning for an early acute  appendicitis. No drainable fluid collection or abscess. 2. Mild colonic diverticulosis. No bowel obstruction. Electronically Signed   By: Elgie Collard M.D.   On: 10/19/2022 21:15    ROS 10 point review of systems is negative except as listed above in HPI.   Physical Exam Blood pressure 131/74, pulse 64, temperature 98.5 F (36.9 C), temperature source Oral, resp. rate 17, height 5\' 10"  (1.778 m), weight 95.3 kg, SpO2 99%. Constitutional: well-developed, well-nourished HEENT: pupils equal, round, reactive to light, 2mm b/l, moist conjunctiva, external inspection of ears and nose normal, hearing intact Oropharynx: normal oropharyngeal mucosa, normal dentition Neck: no thyromegaly, trachea midline, no midline cervical tenderness to palpation Chest: breath sounds equal bilaterally, normal respiratory effort, no midline or lateral chest wall  tenderness to palpation/deformity Abdomen: soft, central and RLQ TTP, no bruising, no hepatosplenomegaly Skin: warm, dry, no rashes Psych: normal memory, normal mood/affect     Assessment/Plan: Acute appendicitis  Acute appendicitis - pan lap appy. Informed consent was obtained after detailed explanation of risks, including bleeding, infection, abscess, staple line leak, stump appendicitis, injury to surrounding structures, and need for conversion to open procedure. All questions answered to the patient's satisfaction. FEN - NPO except sips/chips DVT - SCDs, SQH Dispo -  home post-op     Diamantina Monks, MD General and Trauma Surgery [facility]

## 2022-10-20 ENCOUNTER — Other Ambulatory Visit: Payer: Self-pay

## 2022-10-20 ENCOUNTER — Encounter (HOSPITAL_COMMUNITY)
Admission: EM | Disposition: A | Discharge status: Home / Self Care | Payer: Self-pay | Source: Home / Self Care | Attending: Emergency Medicine

## 2022-10-20 ENCOUNTER — Emergency Department (HOSPITAL_BASED_OUTPATIENT_CLINIC_OR_DEPARTMENT_OTHER): Payer: 59 | Admitting: Registered Nurse

## 2022-10-20 ENCOUNTER — Emergency Department (HOSPITAL_COMMUNITY): Payer: 59 | Admitting: Registered Nurse

## 2022-10-20 ENCOUNTER — Encounter (HOSPITAL_COMMUNITY): Payer: Self-pay

## 2022-10-20 DIAGNOSIS — K3532 Acute appendicitis with perforation and localized peritonitis, without abscess: Secondary | ICD-10-CM | POA: Diagnosis not present

## 2022-10-20 HISTORY — PX: LAPAROSCOPIC APPENDECTOMY: SHX408

## 2022-10-20 SURGERY — APPENDECTOMY, LAPAROSCOPIC
Anesthesia: General | Site: Abdomen

## 2022-10-20 MED ORDER — CHLORHEXIDINE GLUCONATE CLOTH 2 % EX PADS: 6.0000 | MEDICATED_PAD | Freq: Once | CUTANEOUS | Status: DC

## 2022-10-20 MED ORDER — ACETAMINOPHEN 500 MG PO TABS
1000.0000 mg | ORAL_TABLET | ORAL | Status: AC
  Administered 2022-10-20: 1000 mg via ORAL
  Filled 2022-10-20: qty 2

## 2022-10-20 MED ORDER — OXYCODONE HCL 5 MG PO TABS: 5.0000 mg | ORAL_TABLET | ORAL | Status: DC | PRN

## 2022-10-20 MED ORDER — ORAL CARE MOUTH RINSE: 15.0000 mL | Freq: Once | OROMUCOSAL | Status: AC

## 2022-10-20 MED ORDER — BUPIVACAINE-EPINEPHRINE (PF) 0.25% -1:200000 IJ SOLN
INTRAMUSCULAR | Status: AC
  Filled 2022-10-20: qty 30

## 2022-10-20 MED ORDER — CHLORHEXIDINE GLUCONATE 0.12 % MT SOLN
15.0000 mL | Freq: Once | OROMUCOSAL | Status: AC
  Administered 2022-10-20: 15 mL via OROMUCOSAL

## 2022-10-20 MED ORDER — MIDAZOLAM HCL 2 MG/2ML IJ SOLN
INTRAMUSCULAR | Status: AC
  Filled 2022-10-20: qty 2

## 2022-10-20 MED ORDER — OXYCODONE HCL 5 MG PO TABS: 5.0000 mg | ORAL_TABLET | Freq: Four times a day (QID) | ORAL | 0 refills | Status: AC | PRN

## 2022-10-20 MED ORDER — SUCCINYLCHOLINE CHLORIDE 200 MG/10ML IV SOSY
PREFILLED_SYRINGE | INTRAVENOUS | Status: AC
  Filled 2022-10-20: qty 10

## 2022-10-20 MED ORDER — FENTANYL CITRATE (PF) 250 MCG/5ML IJ SOLN
INTRAMUSCULAR | Status: AC
  Filled 2022-10-20: qty 5

## 2022-10-20 MED ORDER — LIDOCAINE 2% (20 MG/ML) 5 ML SYRINGE
INTRAMUSCULAR | Status: DC | PRN
  Administered 2022-10-20: 40 mg via INTRAVENOUS

## 2022-10-20 MED ORDER — DEXAMETHASONE SODIUM PHOSPHATE 10 MG/ML IJ SOLN
INTRAMUSCULAR | Status: DC | PRN
  Administered 2022-10-20: 10 mg via INTRAVENOUS

## 2022-10-20 MED ORDER — MIDAZOLAM HCL 2 MG/2ML IJ SOLN
INTRAMUSCULAR | Status: DC | PRN
  Administered 2022-10-20: 2 mg via INTRAVENOUS

## 2022-10-20 MED ORDER — SUGAMMADEX SODIUM 200 MG/2ML IV SOLN
INTRAVENOUS | Status: DC | PRN
  Administered 2022-10-20: 190.6 mg via INTRAVENOUS

## 2022-10-20 MED ORDER — SODIUM CHLORIDE 0.9 % IR SOLN
Status: DC | PRN
  Administered 2022-10-20: 1000 mL

## 2022-10-20 MED ORDER — DEXAMETHASONE SODIUM PHOSPHATE 10 MG/ML IJ SOLN
INTRAMUSCULAR | Status: AC
  Filled 2022-10-20: qty 1

## 2022-10-20 MED ORDER — AMISULPRIDE (ANTIEMETIC) 5 MG/2ML IV SOLN: 10.0000 mg | Freq: Once | INTRAVENOUS | Status: DC | PRN

## 2022-10-20 MED ORDER — ONDANSETRON HCL 4 MG/2ML IJ SOLN
INTRAMUSCULAR | Status: AC
  Filled 2022-10-20: qty 2

## 2022-10-20 MED ORDER — LACTATED RINGERS IV SOLN: INTRAVENOUS | Status: DC

## 2022-10-20 MED ORDER — BUPIVACAINE-EPINEPHRINE 0.25% -1:200000 IJ SOLN
INTRAMUSCULAR | Status: DC | PRN
  Administered 2022-10-20: 4 mL

## 2022-10-20 MED ORDER — SODIUM CHLORIDE 0.9 % IV SOLN: 2.0000 g | INTRAVENOUS | Status: DC

## 2022-10-20 MED ORDER — FENTANYL CITRATE (PF) 100 MCG/2ML IJ SOLN: 25.0000 ug | INTRAMUSCULAR | Status: DC | PRN

## 2022-10-20 MED ORDER — 0.9 % SODIUM CHLORIDE (POUR BTL) OPTIME
TOPICAL | Status: DC | PRN
  Administered 2022-10-20: 1000 mL

## 2022-10-20 MED ORDER — PROPOFOL 10 MG/ML IV BOLUS
INTRAVENOUS | Status: AC
  Filled 2022-10-20: qty 20

## 2022-10-20 MED ORDER — LIDOCAINE 2% (20 MG/ML) 5 ML SYRINGE
INTRAMUSCULAR | Status: AC
  Filled 2022-10-20: qty 5

## 2022-10-20 MED ORDER — ONDANSETRON HCL 4 MG/2ML IJ SOLN
INTRAMUSCULAR | Status: DC | PRN
  Administered 2022-10-20: 4 mg via INTRAVENOUS

## 2022-10-20 MED ORDER — METRONIDAZOLE 500 MG/100ML IV SOLN
500.0000 mg | Freq: Three times a day (TID) | INTRAVENOUS | Status: DC
  Administered 2022-10-20: 500 mg via INTRAVENOUS
  Filled 2022-10-20: qty 100

## 2022-10-20 MED ORDER — BUPIVACAINE LIPOSOME 1.3 % IJ SUSP: 20.0000 mL | Freq: Once | INTRAMUSCULAR | Status: DC

## 2022-10-20 MED ORDER — FENTANYL CITRATE (PF) 250 MCG/5ML IJ SOLN
INTRAMUSCULAR | Status: DC | PRN
  Administered 2022-10-20 (×3): 50 ug via INTRAVENOUS
  Administered 2022-10-20: 100 ug via INTRAVENOUS

## 2022-10-20 MED ORDER — ROCURONIUM BROMIDE 10 MG/ML (PF) SYRINGE
PREFILLED_SYRINGE | INTRAVENOUS | Status: DC | PRN
  Administered 2022-10-20: 60 mg via INTRAVENOUS

## 2022-10-20 MED ORDER — MORPHINE SULFATE (PF) 2 MG/ML IV SOLN: 2.0000 mg | INTRAVENOUS | Status: DC | PRN

## 2022-10-20 MED ORDER — PHENYLEPHRINE 80 MCG/ML (10ML) SYRINGE FOR IV PUSH (FOR BLOOD PRESSURE SUPPORT)
PREFILLED_SYRINGE | INTRAVENOUS | Status: AC
  Filled 2022-10-20: qty 10

## 2022-10-20 MED ORDER — PROPOFOL 10 MG/ML IV BOLUS
INTRAVENOUS | Status: DC | PRN
  Administered 2022-10-20: 150 mg via INTRAVENOUS

## 2022-10-20 MED ORDER — METHOCARBAMOL 500 MG PO TABS: 1000.0000 mg | ORAL_TABLET | Freq: Three times a day (TID) | ORAL | Status: DC

## 2022-10-20 MED ORDER — HEPARIN SODIUM (PORCINE) 5000 UNIT/ML IJ SOLN
5000.0000 [IU] | Freq: Once | INTRAMUSCULAR | Status: DC
  Filled 2022-10-20: qty 1

## 2022-10-20 MED ORDER — ROCURONIUM BROMIDE 10 MG/ML (PF) SYRINGE
PREFILLED_SYRINGE | INTRAVENOUS | Status: AC
  Filled 2022-10-20: qty 10

## 2022-10-20 MED ORDER — KETOROLAC TROMETHAMINE 15 MG/ML IJ SOLN: 30.0000 mg | Freq: Four times a day (QID) | INTRAMUSCULAR | Status: DC

## 2022-10-20 SURGICAL SUPPLY — 47 items
ADH SKN CLS APL DERMABOND .7 (GAUZE/BANDAGES/DRESSINGS) ×1
APL PRP STRL LF DISP 70% ISPRP (MISCELLANEOUS) ×1
APPLIER CLIP ROT 10 11.4 M/L (STAPLE)
APR CLP MED LRG 11.4X10 (STAPLE)
BAG COUNTER SPONGE SURGICOUNT (BAG) ×2 IMPLANT
BAG SPEC RTRVL 10 TROC 200 (ENDOMECHANICALS) ×1
BAG SPNG CNTER NS LX DISP (BAG)
BLADE CLIPPER SURG (BLADE) IMPLANT
CANISTER SUCT 3000ML PPV (MISCELLANEOUS) ×2 IMPLANT
CHLORAPREP W/TINT 26 (MISCELLANEOUS) ×2 IMPLANT
CLIP APPLIE ROT 10 11.4 M/L (STAPLE) IMPLANT
COVER SURGICAL LIGHT HANDLE (MISCELLANEOUS) ×2 IMPLANT
CUTTER FLEX LINEAR 45M (STAPLE) ×2 IMPLANT
DERMABOND ADVANCED .7 DNX12 (GAUZE/BANDAGES/DRESSINGS) ×2 IMPLANT
ELECT REM PT RETURN 9FT ADLT (ELECTROSURGICAL) ×1
ELECTRODE REM PT RTRN 9FT ADLT (ELECTROSURGICAL) ×2 IMPLANT
ENDOLOOP SUT PDS II 0 18 (SUTURE) IMPLANT
GLOVE BIO SURGEON STRL SZ8 (GLOVE) ×2 IMPLANT
GLOVE BIOGEL PI IND STRL 8 (GLOVE) ×2 IMPLANT
GOWN STRL REUS W/ TWL LRG LVL3 (GOWN DISPOSABLE) ×4 IMPLANT
GOWN STRL REUS W/ TWL XL LVL3 (GOWN DISPOSABLE) ×2 IMPLANT
GOWN STRL REUS W/TWL LRG LVL3 (GOWN DISPOSABLE) ×2
GOWN STRL REUS W/TWL XL LVL3 (GOWN DISPOSABLE) ×2
IRRIG SUCT STRYKERFLOW 2 WTIP (MISCELLANEOUS) ×1
IRRIGATION SUCT STRKRFLW 2 WTP (MISCELLANEOUS) ×2 IMPLANT
KIT BASIN OR (CUSTOM PROCEDURE TRAY) ×2 IMPLANT
KIT TURNOVER KIT B (KITS) ×2 IMPLANT
NS IRRIG 1000ML POUR BTL (IV SOLUTION) ×2 IMPLANT
PAD ARMBOARD 7.5X6 YLW CONV (MISCELLANEOUS) ×4 IMPLANT
POUCH RETRIEVAL ECOSAC 10 (ENDOMECHANICALS) ×2 IMPLANT
RELOAD STAPLE 45 3.5 BLU ETS (ENDOMECHANICALS) ×2 IMPLANT
RELOAD STAPLE TA45 3.5 REG BLU (ENDOMECHANICALS) ×1 IMPLANT
SCISSORS LAP 5X35 DISP (ENDOMECHANICALS) ×2 IMPLANT
SET TUBE SMOKE EVAC HIGH FLOW (TUBING) ×2 IMPLANT
SHEARS HARMONIC ACE PLUS 36CM (ENDOMECHANICALS) ×2 IMPLANT
SPECIMEN JAR SMALL (MISCELLANEOUS) ×2 IMPLANT
SUT MON AB 4-0 PC3 18 (SUTURE) ×2 IMPLANT
TOWEL GREEN STERILE (TOWEL DISPOSABLE) ×2 IMPLANT
TOWEL GREEN STERILE FF (TOWEL DISPOSABLE) ×2 IMPLANT
TRAY FOLEY W/BAG SLVR 16FR (SET/KITS/TRAYS/PACK) ×1
TRAY FOLEY W/BAG SLVR 16FR ST (SET/KITS/TRAYS/PACK) ×2 IMPLANT
TRAY LAPAROSCOPIC MC (CUSTOM PROCEDURE TRAY) ×2 IMPLANT
TROCAR BALLN 12MMX100 BLUNT (TROCAR) ×2 IMPLANT
TROCAR XCEL BLADELESS 5X75MML (TROCAR) ×4 IMPLANT
TROCAR XCEL NON-BLD 5MMX100MML (ENDOMECHANICALS) IMPLANT
WARMER LAPAROSCOPE (MISCELLANEOUS) ×2 IMPLANT
WATER STERILE IRR 1000ML POUR (IV SOLUTION) ×2 IMPLANT

## 2022-10-20 NOTE — Progress Notes (Signed)
CCC Pre-op Review  Pre-op checklist: ED pt, SS will need to complete  NPO: yes per RN  Labs: WNL  Consent: completed  H&P: completed, [place]  Vitals: WNL  O2 requirements: 98% RA  MAR/PTA review: reviewed  IV: 22 LAC  Floor nurse name:  Nehemiah Settle ED RN  Additional info:

## 2022-10-20 NOTE — Interval H&P Note (Signed)
History and Physical Interval Note:  10/20/2022 2:41 PM  Glenn Bowman  has presented today for surgery, with the diagnosis of appy.  The various methods of treatment have been discussed with the patient and family. After consideration of risks, benefits and other options for treatment, the patient has consented to  Procedure(s): APPENDECTOMY LAPAROSCOPIC (N/A) as a surgical intervention.  The patient's history has been reviewed, patient examined, no change in status, stable for surgery.  I have reviewed the patient's chart and labs.  Questions were answered to the patient's satisfaction.     Lenell Lama A My Rinke

## 2022-10-20 NOTE — Op Note (Signed)
Appendectomy, Laparoscopic, Procedure Note  Indications: The patient presented with a history of right-sided abdominal pain. A CT revealed findings consistent with acute appendicitis.  Patient was examined and found to have right lower quadrant pain.  Reviewed laparoscopic appendectomy, medical management and outcomes of each.  Long-term expectations, complications and sequela of both treatment options were reviewed.  He is opted for laparoscopic appendectomy.The procedure has been discussed with the patient.  Alternative therapies have been discussed with the patient.  Operative risks include bleeding,  Infection,  Organ injury,  Nerve injury,  Blood vessel injury,  DVT,  Pulmonary embolism,  Death,  And possible reoperation.  Medical management risks include worsening of present situation.  The success of the procedure is 50 -90 % at treating patients symptoms.  The patient understands and agrees to proceed.   Pre-operative Diagnosis: Acute appendicitis without mention of peritonitis  Post-operative Diagnosis: Same  Surgeon: Dortha Schwalbe  MD   Assistants: OR staff  Anesthesia: General endotracheal anesthesia and Local anesthesia 0.25.% bupivacaine  ASA Class: 2  Procedure Details  The patient was seen again in the Holding Room. The risks, benefits, complications, treatment options, and expected outcomes were discussed with the patient and/or family. The possibilities of reaction to medication, pulmonary aspiration, perforation of viscus, bleeding, recurrent infection, finding a normal appendix, the need for additional procedures, failure to diagnose a condition, and creating a complication requiring transfusion or operation were discussed. There was concurrence with the proposed plan and informed consent was obtained. The site of surgery was properly noted/marked. The patient was taken to Operating Room, identified as Machai Law and the procedure verified as Appendectomy. A Time Out was held  and the above information confirmed.  The patient was placed in the supine position and general anesthesia was induced, along with placement of orogastric tube, Venodyne boots, and a Foley catheter. The abdomen was prepped and draped in a sterile fashion. A one centimeter infraumbilical incision was made and the peritoneal cavity was accessed using the OPEN  technique. The pneumoperitoneum was then established to steady pressure of 12 mmHg. A 12 mm port was placed through the umbilical incision. Additional 5 mm cannulas then placed in the left lower quadrant of the abdomen and right upper quadrant under direct vision. A careful evaluation of the entire abdomen was carried out. The patient was placed in Trendelenburg and left lateral decubitus position. The small intestines were retracted in the cephalad and left lateral direction away from the pelvis and right lower quadrant. The patient was found to have an enlarged and inflamed appendix that was extending into the pelvis. There was no evidence of perforation.  The appendix was carefully dissected. A window was made in the mesoappendix at the base of the appendix. A harmonic scalpel was used across the mesoappendix. The appendix was divided at its base using an endo-GIA stapler. Minimal appendiceal stump was left in place. There was no evidence of bleeding, leakage, or complication after division of the appendix. Irrigation was also performed and irrigate suctioned from the abdomen as well.  The umbilical port site was closed using 0 vicryl pursestring sutures fashion at the level of the fascia. The trocar site skin wounds were closed using 4 0 monocryl.  Instrument, sponge, and needle counts were correct at the conclusion of the case.   Findings: The appendix was found to be inflamed. There were not signs of necrosis.  There was not perforation. There was not abscess formation.  Estimated Blood Loss:  Minimal         Drains: None         Total IV  Fluids: Per OR record         Specimens: Appendix         Complications:  None; patient tolerated the procedure well.         Disposition: PACU - hemodynamically stable.         Condition: stable

## 2022-10-20 NOTE — Transfer of Care (Signed)
Immediate Anesthesia Transfer of Care Note  Patient: Glenn Bowman  Procedure(s) Performed: APPENDECTOMY LAPAROSCOPIC (Abdomen)  Patient Location: PACU  Anesthesia Type:General  Level of Consciousness: awake, alert , and oriented  Airway & Oxygen Therapy: Patient Spontanous Breathing and Patient connected to nasal cannula oxygen  Post-op Assessment: Report given to RN, Post -op Vital signs reviewed and stable, Patient moving all extremities X 4, and Patient able to stick tongue midline  Post vital signs: Reviewed and stable  Last Vitals:  Vitals Value Taken Time  BP 124/69 10/20/22 1635  Temp 98.9   Pulse 55 10/20/22 1636  Resp 9 10/20/22 1636  SpO2 96 % 10/20/22 1636  Vitals shown include unfiled device data.  Last Pain:  Vitals:   10/20/22 1309  TempSrc: Oral  PainSc: 0-No pain         Complications: No notable events documented.

## 2022-10-20 NOTE — Anesthesia Preprocedure Evaluation (Signed)
Anesthesia Evaluation  Patient identified by MRN, date of birth, ID band Patient awake    Reviewed: Allergy & Precautions, NPO status , Patient's Chart, lab work & pertinent test results  Airway Mallampati: II  TM Distance: >3 FB Neck ROM: Full    Dental  (+) Dental Advisory Given   Pulmonary neg pulmonary ROS   breath sounds clear to auscultation       Cardiovascular negative cardio ROS  Rhythm:Regular Rate:Normal     Neuro/Psych negative neurological ROS     GI/Hepatic Neg liver ROS,,,Acute appendicitis    Endo/Other  negative endocrine ROS    Renal/GU negative Renal ROS     Musculoskeletal   Abdominal   Peds  Hematology negative hematology ROS (+)   Anesthesia Other Findings   Reproductive/Obstetrics                             Anesthesia Physical Anesthesia Plan  ASA: 2 and emergent  Anesthesia Plan: General   Post-op Pain Management: Tylenol PO (pre-op)* and Toradol IV (intra-op)*   Induction: Intravenous and Rapid sequence  PONV Risk Score and Plan: 2 and Dexamethasone, Ondansetron and Treatment may vary due to age or medical condition  Airway Management Planned: Oral ETT  Additional Equipment:   Intra-op Plan:   Post-operative Plan: Extubation in OR  Informed Consent: I have reviewed the patients History and Physical, chart, labs and discussed the procedure including the risks, benefits and alternatives for the proposed anesthesia with the patient or authorized representative who has indicated his/her understanding and acceptance.     Dental advisory given  Plan Discussed with: CRNA  Anesthesia Plan Comments:        Anesthesia Quick Evaluation

## 2022-10-20 NOTE — ED Notes (Signed)
Pre-op ready for pt to come to [facility]. Attempted report at this time, RN unable to come to phone for report.

## 2022-10-20 NOTE — Anesthesia Procedure Notes (Signed)
Procedure Name: Intubation Date/Time: 10/20/2022 3:30 PM  Performed by: Cy Blamer, CRNAPre-anesthesia Checklist: Patient identified, Emergency Drugs available, Suction available and Patient being monitored Patient Re-evaluated:Patient Re-evaluated prior to induction Oxygen Delivery Method: Circle system utilized Preoxygenation: Pre-oxygenation with 100% oxygen Induction Type: IV induction Ventilation: Mask ventilation without difficulty Laryngoscope Size: Miller and 2 Grade View: Grade I Tube type: Oral Tube size: 7.5 mm Number of attempts: 1 Airway Equipment and Method: Stylet and Bite block Placement Confirmation: ETT inserted through vocal cords under direct vision, positive ETCO2 and breath sounds checked- equal and bilateral Secured at: 23 cm Tube secured with: Tape Dental Injury: Teeth and Oropharynx as per pre-operative assessment

## 2022-10-20 NOTE — Discharge Instructions (Addendum)

## 2022-10-21 ENCOUNTER — Telehealth: Payer: Self-pay

## 2022-10-21 ENCOUNTER — Encounter (HOSPITAL_COMMUNITY): Payer: Self-pay | Admitting: Surgery

## 2022-10-21 NOTE — Transitions of Care (Post Inpatient/ED Visit) (Signed)
   10/21/2022  Name: Glenn Bowman MRN: [id number] DOB: 08/16/1960  Today's TOC FU Call Status: Today's TOC FU Call Status:: Successful TOC FU Call Completed TOC FU Call Complete Date: 10/21/22 Patient's Name and Date of Birth confirmed.  Transition Care Management Follow-up Telephone Call Date of Discharge: 10/20/22 Discharge Facility: [facility]) Type of Discharge: Inpatient Admission Primary Inpatient Discharge Diagnosis:: APPENDECTOMY LAPAROSCOPIC How have you been since you were released from the hospital?: Better Any questions or concerns?: No  Items Reviewed: Did you receive and understand the discharge instructions provided?: Yes Medications obtained,verified, and reconciled?: Yes (Medications Reviewed) Any new allergies since your discharge?: No Dietary orders reviewed?: Yes Do you have support at home?: Yes  Medications Reviewed Today: Medications Reviewed Today     Reviewed by Merleen Nicely, LPN (Licensed Practical Nurse) on 10/21/22 at 1122  Med List Status: <None>   Medication Order Taking? Sig Documenting Provider Last Dose Status Informant  aspirin EC 81 MG tablet [id number] Yes Take 1 tablet (81 mg total) by mouth daily. Corwin Levins, MD Taking Active Self  doxycycline (PERIOSTAT) 20 MG tablet [id number] Yes Take 20 mg by mouth daily. [provider] Taking Active Self  oxyCODONE (OXY IR/ROXICODONE) 5 MG immediate release tablet [id number] Yes Take 1 tablet (5 mg total) by mouth every 6 (six) hours as needed for severe pain. Meuth, Brooke A, PA-C Taking Active   rosuvastatin (CRESTOR) 10 MG tablet [id number] Yes Take 1 tablet (10 mg total) by mouth daily. Corwin Levins, MD Taking Active Self  sildenafil (VIAGRA) 100 MG tablet [id number] Yes Take 0.5-1 tablets (50-100 mg total) by mouth daily as needed for erectile dysfunction. Corwin Levins, MD Taking Active Self            Home Care and Equipment/Supplies: Were Home Health Services Ordered?:  No Any new equipment or medical supplies ordered?: No  Functional Questionnaire: Do you need assistance with bathing/showering or dressing?: No Do you need assistance with meal preparation?: No Do you need assistance with eating?: No Do you have difficulty maintaining continence: No Do you need assistance with getting out of bed/getting out of a chair/moving?: No Do you have difficulty managing or taking your medications?: No  Follow up appointments reviewed: PCP Follow-up appointment confirmed?: No MD Provider Line [phone] Given: Yes Specialist Hospital Follow-up appointment confirmed?: No Reason Specialist Follow-Up Not Confirmed: Patient has Specialist Provider Number and will Call for Appointment Do you need transportation to your follow-up appointment?: No Do you understand care options if your condition(s) worsen?: Yes-patient verbalized understanding    SIGNATURE  Woodfin Ganja LPN [facility] Nurse Health Advisor Direct Dial [phone]

## 2022-10-21 NOTE — Anesthesia Postprocedure Evaluation (Signed)
Anesthesia Post Note  Patient: Glenn Bowman  Procedure(s) Performed: APPENDECTOMY LAPAROSCOPIC (Abdomen)     Patient location during evaluation: PACU Anesthesia Type: General Level of consciousness: awake and alert Pain management: pain level controlled Vital Signs Assessment: post-procedure vital signs reviewed and stable Respiratory status: spontaneous breathing, nonlabored ventilation, respiratory function stable and patient connected to nasal cannula oxygen Cardiovascular status: blood pressure returned to baseline and stable Postop Assessment: no apparent nausea or vomiting Anesthetic complications: no   No notable events documented.  Last Vitals:  Vitals:   10/20/22 1705 10/20/22 1715  BP: 107/64 119/73  Pulse: 63 (!) 56  Resp: 16 17  Temp:  37.2 C  SpO2: 97% 95%    Last Pain:  Vitals:   10/20/22 1715  TempSrc:   PainSc: 0-No pain                 Kennieth Rad

## 2022-10-29 LAB — SURGICAL PATHOLOGY

## 2022-12-18 NOTE — Plan of Care (Signed)
 [facility] Tonsillectomy/Adenoidectomy, Postoperative PEDS care plan entered in error.

## 2023-03-08 ENCOUNTER — Other Ambulatory Visit: Payer: Self-pay

## 2023-03-08 ENCOUNTER — Other Ambulatory Visit: Payer: Self-pay | Admitting: Internal Medicine

## 2023-03-08 DIAGNOSIS — E785 Hyperlipidemia, unspecified: Secondary | ICD-10-CM

## 2023-03-26 ENCOUNTER — Encounter: Payer: Self-pay | Admitting: Internal Medicine

## 2023-03-26 ENCOUNTER — Ambulatory Visit: Payer: Managed Care, Other (non HMO) | Admitting: Internal Medicine

## 2023-03-26 MED ORDER — SILDENAFIL CITRATE 100 MG PO TABS
50.0000 mg | ORAL_TABLET | Freq: Every day | ORAL | 5 refills | Status: DC | PRN
Start: 2023-03-26 — End: 2023-04-15

## 2023-03-26 NOTE — Progress Notes (Signed)
Patient ID: Glenn Bowman, male   DOB: [birth year], 63 y.o.   MRN: [id number]  Pt left - not seen, stated he did not want to be seen wtihout insurance

## 2023-03-26 NOTE — Patient Instructions (Signed)
 Pt not seen.

## 2023-04-05 ENCOUNTER — Telehealth (HOSPITAL_COMMUNITY): Payer: Self-pay | Admitting: Pharmacy Technician

## 2023-04-05 ENCOUNTER — Other Ambulatory Visit (HOSPITAL_COMMUNITY): Payer: Self-pay

## 2023-04-05 NOTE — Telephone Encounter (Signed)
 Pharmacy Patient Advocate Encounter  Received notification from [employer] that Prior Authorization for SILDENAFIL  100MG   has been DENIED.  Full denial letter will be uploaded to the media tab. See denial reason below.      PA #/Case ID/Reference #: [id number]

## 2023-04-05 NOTE — Telephone Encounter (Signed)
 Pharmacy Patient Advocate Encounter  Received notification from [employer] that Prior Authorization for SILDENAFIL  100MG  has been APPROVED from 03/06/2023 to 04/04/2024. Ran test claim, Copay is $3.09. This test claim was processed through [facility]- copay amounts may vary at other pharmacies due to pharmacy/plan contracts, or as the patient moves through the different stages of their insurance plan.  Insurance will only cover 8 tablets.  PA #/Case ID/Reference #: [id number] KEY# [id number]

## 2023-04-05 NOTE — Telephone Encounter (Signed)
 Pharmacy Patient Advocate Encounter   Received notification from  [employer]  that prior authorization for SILDENAFIL  100MG  is required/requested.   Insurance verification completed.   The patient is insured through [employer] .   Per test claim: PA required; PA submitted to above mentioned insurance via CoverMyMeds Key/confirmation #/EOC [id number] Status is pending

## 2023-04-09 ENCOUNTER — Telehealth: Payer: Self-pay

## 2023-04-09 NOTE — Telephone Encounter (Signed)
Message sent to PA team.

## 2023-04-12 ENCOUNTER — Telehealth: Payer: Self-pay

## 2023-04-12 ENCOUNTER — Other Ambulatory Visit (HOSPITAL_COMMUNITY): Payer: Self-pay

## 2023-04-12 NOTE — Telephone Encounter (Signed)
 Pharmacy Patient Advocate Encounter   Received notification from Pt Calls Messages that prior authorization for Sildenafil 100mg   is required/requested.   Insurance verification completed.   The patient is insured through [employer] .   Per test claim: The current 30 day co-pay is, $3.09.  No PA needed at this time. This test claim was processed through [facility]- copay amounts may vary at other pharmacies due to pharmacy/plan contracts, or as the patient moves through the different stages of their insurance plan.     The patient's insurance has a maximum quantity of 8 tablets per 30 days allowed. There is an authorization on file that is good until 04/04/2024.

## 2023-04-14 ENCOUNTER — Encounter: Payer: Managed Care, Other (non HMO) | Admitting: Internal Medicine

## 2023-04-14 NOTE — Telephone Encounter (Signed)
 Glenn Bowman

## 2023-04-15 MED ORDER — SILDENAFIL CITRATE 100 MG PO TABS: 50.0000 mg | ORAL_TABLET | Freq: Every day | ORAL | 5 refills | Status: AC | PRN

## 2023-04-15 NOTE — Addendum Note (Signed)
 Addended by: Corwin Levins on: 04/15/2023 11:52 AM   Modules accepted: Orders

## 2023-04-28 ENCOUNTER — Ambulatory Visit (INDEPENDENT_AMBULATORY_CARE_PROVIDER_SITE_OTHER): Payer: Managed Care, Other (non HMO) | Admitting: Internal Medicine

## 2023-04-28 ENCOUNTER — Encounter: Payer: Self-pay | Admitting: Internal Medicine

## 2023-04-28 VITALS — BP 124/78 | HR 70 | Temp 97.9°F | Ht 70.0 in | Wt 222.0 lb

## 2023-04-28 DIAGNOSIS — Z0001 Encounter for general adult medical examination with abnormal findings: Secondary | ICD-10-CM

## 2023-04-28 DIAGNOSIS — E785 Hyperlipidemia, unspecified: Secondary | ICD-10-CM | POA: Diagnosis not present

## 2023-04-28 DIAGNOSIS — E559 Vitamin D deficiency, unspecified: Secondary | ICD-10-CM

## 2023-04-28 DIAGNOSIS — E538 Deficiency of other specified B group vitamins: Secondary | ICD-10-CM

## 2023-04-28 DIAGNOSIS — Z125 Encounter for screening for malignant neoplasm of prostate: Secondary | ICD-10-CM

## 2023-04-28 DIAGNOSIS — R739 Hyperglycemia, unspecified: Secondary | ICD-10-CM

## 2023-04-28 LAB — HEPATIC FUNCTION PANEL
ALT: 28 U/L (ref 0–53)
AST: 19 U/L (ref 0–37)
Albumin: 4.4 g/dL (ref 3.5–5.2)
Alkaline Phosphatase: 98 U/L (ref 39–117)
Bilirubin, Direct: 0.1 mg/dL (ref 0.0–0.3)
Total Bilirubin: 0.5 mg/dL (ref 0.2–1.2)
Total Protein: 6.7 g/dL (ref 6.0–8.3)

## 2023-04-28 LAB — URINALYSIS, ROUTINE W REFLEX MICROSCOPIC
Bilirubin Urine: NEGATIVE
Hgb urine dipstick: NEGATIVE
Ketones, ur: NEGATIVE
Leukocytes,Ua: NEGATIVE
Nitrite: NEGATIVE
RBC / HPF: NONE SEEN (ref 0–?)
Specific Gravity, Urine: 1.025 (ref 1.000–1.030)
Total Protein, Urine: NEGATIVE
Urine Glucose: NEGATIVE
Urobilinogen, UA: 0.2 (ref 0.0–1.0)
pH: 6 (ref 5.0–8.0)

## 2023-04-28 LAB — LIPID PANEL
Cholesterol: 154 mg/dL (ref 0–200)
HDL: 55.7 mg/dL (ref >39.00)
LDL Cholesterol: 88 mg/dL (ref 0–99)
NonHDL: 98.59
Total CHOL/HDL Ratio: 3
Triglycerides: 52 mg/dL (ref 0.0–149.0)
VLDL: 10.4 mg/dL (ref 0.0–40.0)

## 2023-04-28 LAB — BASIC METABOLIC PANEL
BUN: 16 mg/dL (ref 6–23)
CO2: 31 meq/L (ref 19–32)
Calcium: 9.1 mg/dL (ref 8.4–10.5)
Chloride: 107 meq/L (ref 96–112)
Creatinine, Ser: 0.96 mg/dL (ref 0.40–1.50)
GFR: 85.93 mL/min (ref >60.00)
Glucose, Bld: 94 mg/dL (ref 70–99)
Potassium: 4.9 meq/L (ref 3.5–5.1)
Sodium: 142 meq/L (ref 135–145)

## 2023-04-28 LAB — PSA: PSA: 1.12 ng/mL (ref 0.10–4.00)

## 2023-04-28 LAB — CBC WITH DIFFERENTIAL/PLATELET
Basophils Absolute: 0 10*3/uL (ref 0.0–0.1)
Basophils Relative: 0.3 % (ref 0.0–3.0)
Eosinophils Absolute: 0.1 10*3/uL (ref 0.0–0.7)
Eosinophils Relative: 2.7 % (ref 0.0–5.0)
HCT: 45.3 % (ref 39.0–52.0)
Hemoglobin: 15.3 g/dL (ref 13.0–17.0)
Lymphocytes Relative: 31.3 % (ref 12.0–46.0)
Lymphs Abs: 1.5 10*3/uL (ref 0.7–4.0)
MCHC: 33.9 g/dL (ref 30.0–36.0)
MCV: 92.5 fl (ref 78.0–100.0)
Monocytes Absolute: 0.5 10*3/uL (ref 0.1–1.0)
Monocytes Relative: 10.5 % (ref 3.0–12.0)
Neutro Abs: 2.6 10*3/uL (ref 1.4–7.7)
Neutrophils Relative %: 55.2 % (ref 43.0–77.0)
Platelets: 177 10*3/uL (ref 150.0–400.0)
RBC: 4.9 Mil/uL (ref 4.22–5.81)
RDW: 13.1 % (ref 11.5–15.5)
WBC: 4.7 10*3/uL (ref 4.0–10.5)

## 2023-04-28 LAB — VITAMIN D 25 HYDROXY (VIT D DEFICIENCY, FRACTURES): VITD: 22.14 ng/mL — ABNORMAL LOW (ref 30.00–100.00)

## 2023-04-28 LAB — TSH: TSH: 3.05 u[IU]/mL (ref 0.35–5.50)

## 2023-04-28 LAB — HEMOGLOBIN A1C: Hgb A1c MFr Bld: 5.6 % (ref 4.6–6.5)

## 2023-04-28 LAB — VITAMIN B12: Vitamin B-12: 197 pg/mL — ABNORMAL LOW (ref 211–911)

## 2023-04-28 NOTE — Progress Notes (Signed)
 Patient ID: Glenn Bowman, male   DOB: 1960-11-02, 63 y.o.   MRN: [id number]         Chief Complaint:: wellness exam and low vit d, low b12, hyperrglycemia, hld,        HPI:  Glenn Bowman is a 63 y.o. male here for wellness exam; for shingrix at pharmacy, declines covid booster, o/w up to date                        Also Pt denies chest pain, increased sob or doe, wheezing, orthopnea, PND, increased LE swelling, palpitations, dizziness or syncope.   Pt denies polydipsia, polyuria, or new focal neuro s/s.    Pt denies fever, wt loss, night sweats, loss of appetite, or other constitutional symptoms    Wt Readings from Last 3 Encounters:  04/28/23 222 lb (100.7 kg)  10/20/22 210 lb (95.3 kg)  03/24/22 213 lb (96.6 kg)   BP Readings from Last 3 Encounters:  04/28/23 124/78  10/20/22 119/73  03/24/22 124/76   Immunization History  Administered Date(s) Administered   Influenza,inj,Quad PF,6+ Mos 12/27/2018, 02/08/2020, 01/03/2023   Influenza-Unspecified 01/20/2016, 11/26/2016, 12/04/2017, 12/26/2017, 01/02/2021, 12/05/2021   PFIZER(Purple Top)SARS-COV-2 Vaccination 04/29/2019, 05/24/2019, 11/23/2019, 12/07/2020, 12/01/2021   Pfizer(Comirnaty)Fall Seasonal Vaccine 12 years and older 12/05/2021   Tdap 07/04/2013   Health Maintenance Due  Topic Date Due   Zoster Vaccines- Shingrix (1 of 2) Never done      Past Medical History:  Diagnosis Date   Allergic rhinitis    Allergy    Hyperlipidemia    Squamous cell skin cancer    Past Surgical History:  Procedure Laterality Date   LAPAROSCOPIC APPENDECTOMY N/A 10/20/2022   Procedure: APPENDECTOMY LAPAROSCOPIC;  Surgeon: Harriette Bouillon, MD;  Location: [facility] OR;  Service: General;  Laterality: N/A;   MOHS SURGERY     TOOTH EXTRACTION      reports that he has never smoked. He has never used smokeless tobacco. He reports current alcohol use. He reports that he does not use drugs. family history includes Cancer in his father; Colon cancer in his  maternal grandmother; Colon polyps in his brother; Heart disease (age of onset: 33) in his father; Heart disease (age of onset: 49) in his mother; Hypertension in his brother, father, and mother. Allergies  Allergen Reactions   Thimerosal (Thiomersal) Other (See Comments)    Ocular irritation   Current Outpatient Medications on File Prior to Visit  Medication Sig Dispense Refill   aspirin EC 81 MG tablet Take 1 tablet (81 mg total) by mouth daily. 90 tablet 11   doxycycline (PERIOSTAT) 20 MG tablet Take 20 mg by mouth daily.     oxyCODONE (OXY IR/ROXICODONE) 5 MG immediate release tablet Take 1 tablet (5 mg total) by mouth every 6 (six) hours as needed for severe pain. 15 tablet 0   rosuvastatin (CRESTOR) 10 MG tablet TAKE 1 TABLET DAILY 90 tablet 3   sildenafil (VIAGRA) 100 MG tablet Take 0.5-1 tablets (50-100 mg total) by mouth daily as needed for erectile dysfunction. 15 tablet 5   No current facility-administered medications on file prior to visit.        ROS:  All others reviewed and negative.  Objective        PE:  BP 124/78 (BP Location: Left Arm, Patient Position: Sitting, Cuff Size: Normal)   Pulse 70   Temp 97.9 F (36.6 C) (Oral)   Ht 5\' 10"  (1.778 m)  Wt 222 lb (100.7 kg)   SpO2 97%   BMI 31.85 kg/m                 Constitutional: Pt appears in NAD               HENT: Head: NCAT.                Right Ear: External ear normal.                 Left Ear: External ear normal.                Eyes: . Pupils are equal, round, and reactive to light. Conjunctivae and EOM are normal               Nose: without d/c or deformity               Neck: Neck supple. Gross normal ROM               Cardiovascular: Normal rate and regular rhythm.                 Pulmonary/Chest: Effort normal and breath sounds without rales or wheezing.                Abd:  Soft, NT, ND, + BS, no organomegaly               Neurological: Pt is alert. At baseline orientation, motor grossly intact                Skin: Skin is warm. No rashes, no other new lesions, LE edema - none               Psychiatric: Pt behavior is normal without agitation   Micro: none  Cardiac tracings I have personally interpreted today:  none  Pertinent Radiological findings (summarize): none   Lab Results  Component Value Date   WBC 4.7 04/28/2023   HGB 15.3 04/28/2023   HCT 45.3 04/28/2023   PLT 177.0 04/28/2023   GLUCOSE 94 04/28/2023   CHOL 154 04/28/2023   TRIG 52.0 04/28/2023   HDL 55.70 04/28/2023   LDLCALC 88 04/28/2023   ALT 28 04/28/2023   AST 19 04/28/2023   NA 142 04/28/2023   K 4.9 04/28/2023   CL 107 04/28/2023   CREATININE 0.96 04/28/2023   BUN 16 04/28/2023   CO2 31 04/28/2023   TSH 3.05 04/28/2023   PSA 1.12 04/28/2023   HGBA1C 5.6 04/28/2023   Assessment/Plan:  Glenn Bowman is a 63 y.o. Other or two or more races [6] male with  has a past medical history of Allergic rhinitis, Allergy, Hyperlipidemia, and Squamous cell skin cancer.  Encounter for well adult exam with abnormal findings Age and sex appropriate education and counseling updated with regular exercise and diet Referrals for preventative services - none needed Immunizations addressed - declines covid booster, for shingrix at pharmacy Smoking counseling  - none needed Evidence for depression or other mood disorder - none significant Most recent labs reviewed. I have personally reviewed and have noted: 1) the patient's medical and social history 2) The patient's current medications and supplements 3) The patient's height, weight, and BMI have been recorded in the chart   B12 deficiency Lab Results  Component Value Date   VITAMINB12 197 (L) 04/28/2023   Low, to start oral replacement - b12 1000 mcg qd   Hyperglycemia Lab Results  Component Value Date  HGBA1C 5.6 04/28/2023   Stable, pt to continue current medical treatment  - diet, wt control   Hyperlipidemia Lab Results  Component Value Date    LDLCALC 88 04/28/2023   Stable, pt to continue current statin crestor 10 mg qd    Vitamin D deficiency Last vitamin D Lab Results  Component Value Date   VD25OH 22.14 (L) 04/28/2023   Low, to start oral replacement  Followup: Return in about 1 year (around 04/27/2024).  Oliver Barre, MD 05/02/2023 11:13 AM [facility]

## 2023-04-28 NOTE — Patient Instructions (Signed)

## 2023-05-02 ENCOUNTER — Encounter: Payer: Self-pay | Admitting: Internal Medicine

## 2023-05-02 NOTE — Assessment & Plan Note (Signed)
 Lab Results  Component Value Date   VITAMINB12 197 (L) 04/28/2023   Low, to start oral replacement - b12 1000 mcg qd

## 2023-05-02 NOTE — Assessment & Plan Note (Signed)
 Lab Results  Component Value Date   HGBA1C 5.6 04/28/2023   Stable, pt to continue current medical treatment  - diet, wt control

## 2023-05-02 NOTE — Assessment & Plan Note (Signed)
 Age and sex appropriate education and counseling updated with regular exercise and diet Referrals for preventative services - none needed Immunizations addressed - declines covid booster, for shingrix at pharmacy Smoking counseling  - none needed Evidence for depression or other mood disorder - none significant Most recent labs reviewed. I have personally reviewed and have noted: 1) the patient's medical and social history 2) The patient's current medications and supplements 3) The patient's height, weight, and BMI have been recorded in the chart

## 2023-05-02 NOTE — Assessment & Plan Note (Signed)
 Lab Results  Component Value Date   LDLCALC 88 04/28/2023   Stable, pt to continue current statin crestor 10 mg qd

## 2023-05-02 NOTE — Assessment & Plan Note (Signed)
 Last vitamin D Lab Results  Component Value Date   VD25OH 22.14 (L) 04/28/2023   Low, to start oral replacement

## 2023-08-11 ENCOUNTER — Encounter: Payer: Self-pay | Admitting: Internal Medicine

## 2023-08-12 ENCOUNTER — Other Ambulatory Visit: Payer: Self-pay

## 2023-08-12 DIAGNOSIS — E785 Hyperlipidemia, unspecified: Secondary | ICD-10-CM

## 2023-08-12 MED ORDER — ROSUVASTATIN CALCIUM 10 MG PO TABS: 10.0000 mg | ORAL_TABLET | Freq: Every day | ORAL | 3 refills | Status: AC
